# Patient Record
Sex: Female | Born: 1995 | Race: Black or African American | Hispanic: No | Marital: Single | Smoking: Never smoker
Health system: Southern US, Community
[De-identification: ages and names within clinical notes are randomized; demographics above are authoritative.]

## PROBLEM LIST (undated history)

## (undated) DIAGNOSIS — Z789 Other specified health status: Secondary | ICD-10-CM

## (undated) HISTORY — PX: APPENDECTOMY: SHX54

## (undated) HISTORY — PX: DILATION AND CURETTAGE OF UTERUS: SHX78

## (undated) HISTORY — DX: Other specified health status: Z78.9

---

## 2018-03-24 LAB — OB RESULTS CONSOLE ABO/RH: RH Type: POSITIVE

## 2018-03-24 LAB — OB RESULTS CONSOLE HGB/HCT, BLOOD: Hemoglobin: 10.2

## 2018-03-24 LAB — OB RESULTS CONSOLE GC/CHLAMYDIA
Chlamydia: NEGATIVE
Gonorrhea: NEGATIVE

## 2018-03-24 LAB — OB RESULTS CONSOLE PLATELET COUNT: Platelets: 162

## 2018-03-24 LAB — OB RESULTS CONSOLE ANTIBODY SCREEN: ANTIBODY SCREEN: NEGATIVE

## 2018-03-24 LAB — OB RESULTS CONSOLE HIV ANTIBODY (ROUTINE TESTING): HIV: NONREACTIVE

## 2018-03-24 LAB — OB RESULTS CONSOLE RUBELLA ANTIBODY, IGM: RUBELLA: IMMUNE

## 2018-03-24 LAB — OB RESULTS CONSOLE HEPATITIS B SURFACE ANTIGEN: HEP B S AG: NEGATIVE

## 2018-05-01 LAB — GLUCOSE, 1 HOUR: Glucose 1 Hour: 127

## 2018-05-19 LAB — OB RESULTS CONSOLE HGB/HCT, BLOOD: Hemoglobin: 11.2

## 2018-05-19 LAB — OB RESULTS CONSOLE PLATELET COUNT: PLATELETS: 160

## 2018-07-11 ENCOUNTER — Encounter: Payer: Self-pay | Admitting: Advanced Practice Midwife

## 2018-07-11 ENCOUNTER — Ambulatory Visit (INDEPENDENT_AMBULATORY_CARE_PROVIDER_SITE_OTHER): Payer: BLUE CROSS/BLUE SHIELD | Admitting: Advanced Practice Midwife

## 2018-07-11 DIAGNOSIS — Z3483 Encounter for supervision of other normal pregnancy, third trimester: Secondary | ICD-10-CM

## 2018-07-11 DIAGNOSIS — O99013 Anemia complicating pregnancy, third trimester: Secondary | ICD-10-CM

## 2018-07-11 DIAGNOSIS — Z3480 Encounter for supervision of other normal pregnancy, unspecified trimester: Secondary | ICD-10-CM | POA: Insufficient documentation

## 2018-07-11 DIAGNOSIS — D649 Anemia, unspecified: Secondary | ICD-10-CM

## 2018-07-11 DIAGNOSIS — Z113 Encounter for screening for infections with a predominantly sexual mode of transmission: Secondary | ICD-10-CM

## 2018-07-11 DIAGNOSIS — Z8759 Personal history of other complications of pregnancy, childbirth and the puerperium: Secondary | ICD-10-CM | POA: Insufficient documentation

## 2018-07-11 DIAGNOSIS — O99019 Anemia complicating pregnancy, unspecified trimester: Secondary | ICD-10-CM | POA: Insufficient documentation

## 2018-07-11 LAB — OB RESULTS CONSOLE GBS: GBS: NEGATIVE

## 2018-07-11 NOTE — Patient Instructions (Addendum)
Group B Streptococcus Infection During Pregnancy Group B Streptococcus (GBS) is a type of bacteria (Streptococcus agalactiae) that is often found in healthy people, commonly in the rectum, vagina, and intestines. In people who are healthy and not pregnant, the bacteria rarely cause serious illness or complications. However, women who test positive for GBS during pregnancy can pass the bacteria to their baby during childbirth, which can cause serious infection in the baby after birth. Women with GBS may also have infections during their pregnancy or immediately after childbirth, such as such as urinary tract infections (UTIs) or infections of the uterus (uterine infections). Having GBS also increases a woman's risk of complications during pregnancy, such as early (preterm) labor or delivery, miscarriage, or stillbirth. Routine testing (screening) for GBS is recommended for all pregnant women. What increases the risk? You may have a higher risk for GBS infection during pregnancy if you had one during a past pregnancy. What are the signs or symptoms? In most cases, GBS infection does not cause symptoms in pregnant women. Signs and symptoms of a possible GBS-related infection may include:  Labor starting before the 37th week of pregnancy.  A UTI or bladder infection, which may cause: ? Fever. ? Pain or burning during urination. ? Frequent urination.  Fever during labor, along with: ? Bad-smelling discharge. ? Uterine tenderness. ? Rapid heartbeat in the mother, baby, or both.  Rare but serious symptoms of a possible GBS-related infection in women include:  Blood infection (septicemia). This may cause fever, chills, or confusion.  Lung infection (pneumonia). This may cause fever, chills, cough, rapid breathing, difficulty breathing, or chest pain.  Bone, joint, skin, or soft tissue infection.  How is this diagnosed? You may be screened for GBS between week 35 and week 37 of your pregnancy. If  you have symptoms of preterm labor, you may be screened earlier. This condition is diagnosed based on lab test results from:  A swab of fluid from the vagina and rectum.  A urine sample.  How is this treated? This condition is treated with antibiotic medicine. When you go into labor, or as soon as your water breaks (your membranes rupture), you will be given antibiotics through an IV tube. Antibiotics will continue until after you give birth. If you are having a cesarean delivery, you do not need antibiotics unless your membranes have already ruptured. Follow these instructions at home:  Take over-the-counter and prescription medicines only as told by your health care provider.  Take your antibiotic medicine as told by your health care provider. Do not stop taking the antibiotic even if you start to feel better.  Keep all pre-birth (prenatal) visits and follow-up visits as told by your health care provider. This is important. Contact a health care provider if:  You have pain or burning when you urinate.  You have to urinate frequently.  You have a fever or chills.  You develop a bad-smelling vaginal discharge. Get help right away if:  Your membranes rupture.  You go into labor.  You have severe pain in your abdomen.  You have difficulty breathing.  You have chest pain. This information is not intended to replace advice given to you by your health care provider. Make sure you discuss any questions you have with your health care provider. Document Released: 03/14/2008 Document Revised: 07/02/2016 Document Reviewed: 07/01/2016 Elsevier Interactive Patient Education  2018 Else[employer] Third Trimester of Pregnancy The third trimester is from week 29 through week 42, months 7 through 9. This   This trimester is when your unborn baby (fetus) is growing very fast. At the end of the ninth month, the unborn baby is about 20 inches in length. It weighs about 6-10 pounds. Follow these  instructions at home:  Avoid all smoking, herbs, and alcohol. Avoid drugs not approved by your doctor.  Do not use any tobacco products, including cigarettes, chewing tobacco, and electronic cigarettes. If you need help quitting, ask your doctor. You may get counseling or other support to help you quit.  Only take medicine as told by your doctor. Some medicines are safe and some are not during pregnancy.  Exercise only as told by your doctor. Stop exercising if you start having cramps.  Eat regular, healthy meals.  Wear a good support bra if your breasts are tender.  Do not use hot tubs, steam rooms, or saunas.  Wear your seat belt when driving.  Avoid raw meat, uncooked cheese, and liter boxes and soil used by cats.  Take your prenatal vitamins.  Take 1500-2000 milligrams of calcium daily starting at the 20th week of pregnancy until you deliver your baby.  Try taking medicine that helps you poop (stool softener) as needed, and if your doctor approves. Eat more fiber by eating fresh fruit, vegetables, and whole grains. Drink enough fluids to keep your pee (urine) clear or pale yellow.  Take warm water baths (sitz baths) to soothe pain or discomfort caused by hemorrhoids. Use hemorrhoid cream if your doctor approves.  If you have puffy, bulging veins (varicose veins), wear support hose. Raise (elevate) your feet for 15 minutes, 3-4 times a day. Limit salt in your diet.  Avoid heavy lifting, wear low heels, and sit up straight.  Rest with your legs raised if you have leg cramps or low back pain.  Visit your dentist if you have not gone during your pregnancy. Use a soft toothbrush to brush your teeth. Be gentle when you floss.  You can have sex (intercourse) unless your doctor tells you not to.  Do not travel far distances unless you must. Only do so with your doctor's approval.  Take prenatal classes.  Practice driving to the hospital.  Pack your hospital bag.  Prepare the  baby's room.  Go to your doctor visits. Get help if:  You are not sure if you are in labor or if your water has broken.  You are dizzy.  You have mild cramps or pressure in your lower belly (abdominal).  You have a nagging pain in your belly area.  You continue to feel sick to your stomach (nauseous), throw up (vomit), or have watery poop (diarrhea).  You have bad smelling fluid coming from your vagina.  You have pain with peeing (urination). Get help right away if:  You have a fever.  You are leaking fluid from your vagina.  You are spotting or bleeding from your vagina.  You have severe belly cramping or pain.  You lose or gain weight rapidly.  You have trouble catching your breath and have chest pain.  You notice sudden or extreme puffiness (swelling) of your face, hands, ankles, feet, or legs.  You have not felt the baby move in over an hour.  You have severe headaches that do not go away with medicine.  You have vision changes. This information is not intended to replace advice given to you by your health care provider. Make sure you discuss any questions you have with your health care provider. Document Released: 03/02/2010 Document Revised:  Document Reviewed: 02/06/2013 Elsevier Interactive Patient Education  2017 Elsevier Inc.  

## 2018-07-11 NOTE — Progress Notes (Signed)
  Subjective:    Blair HaileyLidwina Perrone is being seen today for her first obstetrical visit.  She is a transfer from [place].  Moved here to be with FOB (not with her today, he works in [place] 3 days a week).  Just moved here.  Records reviewed.  No major problems with pregnancy so far.     This is not a planned pregnancy. She is at 1882w2d gestation. Her obstetrical history is significant for late transfer of care. Relationship with FOB: significant other, living together. Patient does intend to breast feed. Pregnancy history fully reviewed.  Patient reports no complaints.  Review of Systems:   Review of Systems  Objective:     BP 107/67   Pulse 93   Ht 5\' 2"  (1.575 m)   Wt 145 lb (65.8 kg)   LMP 10/30/2017 (Exact Date)   BMI 26.52 kg/m  Physical Exam  Exam    Assessment:    Pregnancy: [id number] Patient Active Problem List   Diagnosis Date Noted  . Supervision of other normal pregnancy, antepartum 07/11/2018  . 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 Anemia in pregnancy 07/11/2018       Plan:     Initial labs drawn. Prenatal vitamins. Problem list reviewed and updated. AFP3 discussed: declined. Role of ultrasound in pregnancy discussed; fetal survey: results reviewed. Amniocentesis discussed: not indicated. Follow up in 1 weeks. 50% of 30 min visit spent on counseling and coordination of care.   Welcomed to practice [facility], discussed [facility], discussed [facility] for after baby born Routines reviewed GBS and cultures done today   Wynelle BourgeoisMarie Chukwudi Ewen 07/11/2018

## 2018-07-12 LAB — GC/CHLAMYDIA PROBE AMP (~~LOC~~) NOT AT ARMC
CHLAMYDIA, DNA PROBE: NEGATIVE
Neisseria Gonorrhea: NEGATIVE

## 2018-07-15 LAB — CULTURE, BETA STREP (GROUP B ONLY): Strep Gp B Culture: NEGATIVE

## 2018-07-18 ENCOUNTER — Ambulatory Visit (INDEPENDENT_AMBULATORY_CARE_PROVIDER_SITE_OTHER): Payer: BLUE CROSS/BLUE SHIELD | Admitting: Advanced Practice Midwife

## 2018-07-18 DIAGNOSIS — Z3483 Encounter for supervision of other normal pregnancy, third trimester: Secondary | ICD-10-CM

## 2018-07-18 DIAGNOSIS — Z3480 Encounter for supervision of other normal pregnancy, unspecified trimester: Secondary | ICD-10-CM

## 2018-07-18 NOTE — Progress Notes (Signed)
   PRENATAL VISIT NOTE  Subjective:  Alexandria Barber is a 22 y.o. G3P0020 at 2965w2d being seen today for ongoing prenatal care.  She is currently monitored for the following issues for this low-risk pregnancy and has Supervision of other normal pregnancy, antepartum; History of molar pregnancy; and Anemia in pregnancy on their problem list.  Patient reports no complaints.  Contractions: Irregular. Vag. Bleeding: None.  Movement: Present. Denies leaking of fluid.   The following portions of the patient's history were reviewed and updated as appropriate: allergies, current medications, past family history, past medical history, past social history, past surgical history and problem list. Problem list updated.  Objective:   Vitals:   07/18/18 1028  BP: 108/64  Pulse: 92  Weight: 148 lb 1.3 oz (67.2 kg)    Fetal Status: Fetal Heart Rate (bpm): 139   Movement: Present     General:  Alert, oriented and cooperative. Patient is in no acute distress.  Skin: Skin is warm and dry. No rash noted.   Cardiovascular: Normal heart rate noted  Respiratory: Normal respiratory effort, no problems with respiration noted  Abdomen: Soft, gravid, appropriate for gestational age.  Pain/Pressure: Present     Pelvic: Cervical exam deferred       Declines exam  Extremities: Normal range of motion.  Edema: Trace  Mental Status: Normal mood and affect. Normal behavior. Normal judgment and thought content.   Assessment and Plan:  Pregnancy: G3P0020 at 3065w2d  Travel:  States needs to drive to [place] this week "to do something important". Only one day.  States someone else is driving.  Discussed precautions.  Fluid, frequent breaks to walk, how to find care if goes into labor or other emergency.  Has copy of records.  Informed GBS is Negative . Term labor symptoms and general obstetric precautions including but not limited to vaginal bleeding, contractions, leaking of fluid and fetal movement were reviewed in  detail with the patient. Please refer to After Visit Summary for other counseling recommendations.    Future Appointments  Date Time Provider Department Center  07/25/2018  9:10 AM Katrinka BlazingSmith, [place], CNM [facility] None    Wynelle BourgeoisMarie Williams, CNM

## 2018-07-20 ENCOUNTER — Encounter: Payer: Self-pay | Admitting: Advanced Practice Midwife

## 2018-07-20 NOTE — Patient Instructions (Signed)

## 2018-07-25 ENCOUNTER — Encounter: Payer: Self-pay | Admitting: Nurse Practitioner

## 2018-07-25 ENCOUNTER — Ambulatory Visit (INDEPENDENT_AMBULATORY_CARE_PROVIDER_SITE_OTHER): Payer: BLUE CROSS/BLUE SHIELD | Admitting: Nurse Practitioner

## 2018-07-25 VITALS — BP 111/59 | HR 80 | Wt 152.0 lb

## 2018-07-25 DIAGNOSIS — Z3480 Encounter for supervision of other normal pregnancy, unspecified trimester: Secondary | ICD-10-CM

## 2018-07-25 NOTE — Progress Notes (Signed)
    Subjective:  Alexandria Barber is a 22 y.o. G3P0020 at 6828w2d being seen today for ongoing prenatal care.  She is currently monitored for the following issues for this low-risk pregnancy and has Supervision of other normal pregnancy, antepartum; History of molar pregnancy; and Anemia in pregnancy on their problem list.  Patient reports pain in hands bilaterally.  Contractions: Irritability. Vag. Bleeding: None.  Movement: Present. Denies leaking of fluid.   The following portions of the patient's history were reviewed and updated as appropriate: allergies, current medications, past family history, past medical history, past social history, past surgical history and problem list. Problem list updated.  Objective:   Vitals:   07/25/18 0942  BP: (!) 111/59  Pulse: 80  Weight: 152 lb (68.9 kg)    Fetal Status: Fetal Heart Rate (bpm): 145 Fundal Height: 38 cm Movement: Present     General:  Alert, oriented and cooperative. Patient is in no acute distress.  Skin: Skin is warm and dry. No rash noted.   Cardiovascular: Normal heart rate noted  Respiratory: Normal respiratory effort, no problems with respiration noted  Abdomen: Soft, gravid, appropriate for gestational age. Pain/Pressure: Present     Pelvic:  Cervical exam deferred        Extremities: Normal range of motion.  Edema: None  Mental Status: Normal mood and affect. Normal behavior. Normal judgment and thought content.   Urinalysis: Urine Protein: Negative Urine Glucose: Negative  Assessment and Plan:  Pregnancy: G3P0020 at 4528w2d  1. Supervision of other normal pregnancy, antepartum Doing well.  Having some carpal tunnel pain in hands bilaterally but declined getting some braces for her wrists at night.  Advised it will likely improve once the baby is born.  No edema in ankles but noted small amount in upper feet and hands.  Term labor symptoms and general obstetric precautions including but not limited to vaginal bleeding,  contractions, leaking of fluid and fetal movement were reviewed in detail with the patient. Please refer to After Visit Summary for other counseling recommendations.  Return in about 1 week (around 08/01/2018).  Nolene BernheimERRI Georganna Maxson, RN, MSN, NP-BC Nurse Practitioner, [facility], [facility] 07/25/2018 10:11 AM

## 2018-07-25 NOTE — Patient Instructions (Signed)
Braxton Hicks Contractions °Contractions of the uterus can occur throughout pregnancy, but they are not always a sign that you are in labor. You may have practice contractions called Braxton Hicks contractions. These false labor contractions are sometimes confused with true labor. °What are Braxton Hicks contractions? °Braxton Hicks contractions are tightening movements that occur in the muscles of the uterus before labor. Unlike true labor contractions, these contractions do not result in opening (dilation) and thinning of the cervix. Toward the end of pregnancy (32-34 weeks), Braxton Hicks contractions can happen more often and may become stronger. These contractions are sometimes difficult to tell apart from true labor because they can be very uncomfortable. You should not feel embarrassed if you go to the hospital with false labor. °Sometimes, the only way to tell if you are in true labor is for your health care provider to look for changes in the cervix. The health care provider will do a physical exam and may monitor your contractions. If you are not in true labor, the exam should show that your cervix is not dilating and your water has not broken. °If there are other health problems associated with your pregnancy, it is completely safe for you to be sent home with false labor. You may continue to have Braxton Hicks contractions until you go into true labor. °How to tell the difference between true labor and false labor °True labor °· Contractions last 30-70 seconds. °· Contractions become very regular. °· Discomfort is usually felt in the top of the uterus, and it spreads to the lower abdomen and low back. °· Contractions do not go away with walking. °· Contractions usually become more intense and increase in frequency. °· The cervix dilates and gets thinner. °False labor °· Contractions are usually shorter and not as strong as true labor contractions. °· Contractions are usually irregular. °· Contractions  are often felt in the front of the lower abdomen and in the groin. °· Contractions may go away when you walk around or change positions while lying down. °· Contractions get weaker and are shorter-lasting as time goes on. °· The cervix usually does not dilate or become thin. °Follow these instructions at home: °· Take over-the-counter and prescription medicines only as told by your health care provider. °· Keep up with your usual exercises and follow other instructions from your health care provider. °· Eat and drink lightly if you think you are going into labor. °· If Braxton Hicks contractions are making you uncomfortable: °? Change your position from lying down or resting to walking, or change from walking to resting. °? Sit and rest in a tub of warm water. °? Drink enough fluid to keep your urine pale yellow. Dehydration may cause these contractions. °? Do slow and deep breathing several times an hour. °· Keep all follow-up prenatal visits as told by your health care provider. This is important. °Contact a health care provider if: °· You have a fever. °· You have continuous pain in your abdomen. °Get help right away if: °· Your contractions become stronger, more regular, and closer together. °· You have fluid leaking or gushing from your vagina. °· You pass blood-tinged mucus (bloody show). °· You have bleeding from your vagina. °· You have low back pain that you never had before. °· You feel your baby’s head pushing down and causing pelvic pressure. °· Your baby is not moving inside you as much as it used to. °Summary °· Contractions that occur before labor are called Braxton   Hicks contractions, false labor, or practice contractions. °· Braxton Hicks contractions are usually shorter, weaker, farther apart, and less regular than true labor contractions. True labor contractions usually become progressively stronger and regular and they become more frequent. °· Manage discomfort from Braxton Hicks contractions by  changing position, resting in a warm bath, drinking plenty of water, or practicing deep breathing. °This information is not intended to replace advice given to you by your health care provider. Make sure you discuss any questions you have with your health care provider. °Document Released: 04/21/2017 Document Revised: 04/21/2017 Document Reviewed: 04/21/2017 °Elsevier Interactive Patient Education © 2018 Elsevier Inc. ° °

## 2018-08-03 ENCOUNTER — Ambulatory Visit (INDEPENDENT_AMBULATORY_CARE_PROVIDER_SITE_OTHER): Payer: BLUE CROSS/BLUE SHIELD | Admitting: Family Medicine

## 2018-08-03 ENCOUNTER — Telehealth (HOSPITAL_COMMUNITY): Payer: Self-pay | Admitting: *Deleted

## 2018-08-03 ENCOUNTER — Encounter (HOSPITAL_COMMUNITY): Payer: Self-pay | Admitting: *Deleted

## 2018-08-03 VITALS — BP 103/61 | HR 83 | Wt 153.1 lb

## 2018-08-03 DIAGNOSIS — Z3483 Encounter for supervision of other normal pregnancy, third trimester: Secondary | ICD-10-CM | POA: Diagnosis not present

## 2018-08-03 DIAGNOSIS — O99019 Anemia complicating pregnancy, unspecified trimester: Secondary | ICD-10-CM

## 2018-08-03 DIAGNOSIS — Z3480 Encounter for supervision of other normal pregnancy, unspecified trimester: Secondary | ICD-10-CM

## 2018-08-03 DIAGNOSIS — O99013 Anemia complicating pregnancy, third trimester: Secondary | ICD-10-CM

## 2018-08-03 NOTE — Telephone Encounter (Signed)
Preadmission screen  

## 2018-08-03 NOTE — Progress Notes (Signed)
   PRENATAL VISIT NOTE  Subjective:  Alexandria Barber is a 22 y.o. G3P0020 at 3238w4d being seen today for ongoing prenatal care.  She is currently monitored for the following issues for this low-risk pregnancy and has Supervision of other normal pregnancy, antepartum; History of molar pregnancy; and Anemia in pregnancy on their problem list.  Patient reports no complaints.  Contractions: Irregular. Vag. Bleeding: None.  Movement: Present. Denies leaking of fluid.   The following portions of the patient's history were reviewed and updated as appropriate: allergies, current medications, past family history, past medical history, past social history, past surgical history and problem list. Problem list updated.  Objective:   Vitals:   08/03/18 0934  BP: 103/61  Pulse: 83  Weight: 153 lb 1.9 oz (69.5 kg)    Fetal Status: Fetal Heart Rate (bpm): 132 Fundal Height: 39 cm Movement: Present  Presentation: Vertex  General:  Alert, oriented and cooperative. Patient is in no acute distress.  Skin: Skin is warm and dry. No rash noted.   Cardiovascular: Normal heart rate noted  Respiratory: Normal respiratory effort, no problems with respiration noted  Abdomen: Soft, gravid, appropriate for gestational age.  Pain/Pressure: Present     Pelvic: Cervical exam performed Dilation: 1 Effacement (%): 30 Station: -3  Extremities: Normal range of motion.  Edema: Trace  Mental Status: Normal mood and affect. Normal behavior. Normal judgment and thought content.   Assessment and Plan:  Pregnancy: G3P0020 at 5838w4d  1. Supervision of other normal pregnancy, antepartum FHT and FH normal BPP next week Induction scheduled for 8/25 Will do outpt foley balloon  2. Antepartum anemia   Term labor symptoms and general obstetric precautions including but not limited to vaginal bleeding, contractions, leaking of fluid and fetal movement were reviewed in detail with the patient. Please refer to After Visit  Summary for other counseling recommendations.  No follow-ups on file.  Future Appointments  Date Time Provider Department Center  08/10/2018 10:15 AM Alexandria Barber, [name], MD [facility] None    Alexandria HeritageJacob J Stinson, DO

## 2018-08-05 ENCOUNTER — Inpatient Hospital Stay (HOSPITAL_COMMUNITY)
Admission: AD | Admit: 2018-08-05 | Discharge: 2018-08-05 | Disposition: A | Discharge status: Home / Self Care | Payer: BLUE CROSS/BLUE SHIELD | Source: Ambulatory Visit | Attending: Family Medicine | Admitting: Family Medicine

## 2018-08-05 ENCOUNTER — Inpatient Hospital Stay (HOSPITAL_COMMUNITY)
Admission: AD | Admit: 2018-08-05 | Discharge: 2018-08-08 | DRG: 807 | Disposition: A | Discharge status: Home / Self Care | Payer: BLUE CROSS/BLUE SHIELD | Attending: Family Medicine | Admitting: Family Medicine

## 2018-08-05 ENCOUNTER — Encounter (HOSPITAL_COMMUNITY): Payer: Self-pay

## 2018-08-05 DIAGNOSIS — O471 False labor at or after 37 completed weeks of gestation: Secondary | ICD-10-CM

## 2018-08-05 DIAGNOSIS — Z8759 Personal history of other complications of pregnancy, childbirth and the puerperium: Secondary | ICD-10-CM

## 2018-08-05 DIAGNOSIS — Z3480 Encounter for supervision of other normal pregnancy, unspecified trimester: Secondary | ICD-10-CM

## 2018-08-05 DIAGNOSIS — D649 Anemia, unspecified: Secondary | ICD-10-CM | POA: Diagnosis present

## 2018-08-05 DIAGNOSIS — O99019 Anemia complicating pregnancy, unspecified trimester: Secondary | ICD-10-CM

## 2018-08-05 DIAGNOSIS — Z3A40 40 weeks gestation of pregnancy: Secondary | ICD-10-CM

## 2018-08-05 DIAGNOSIS — O9902 Anemia complicating childbirth: Principal | ICD-10-CM | POA: Diagnosis present

## 2018-08-05 MED ORDER — OXYCODONE HCL 5 MG PO TABS
5.0000 mg | ORAL_TABLET | Freq: Once | ORAL | Status: AC
  Administered 2018-08-05: 5 mg via ORAL
  Filled 2018-08-05: qty 1

## 2018-08-05 NOTE — MAU Note (Signed)
Urine in lab 

## 2018-08-05 NOTE — MAU Note (Signed)
Pt having ctx, about 7 minutes apart. No leaking or bleeding, just some bloody show.

## 2018-08-05 NOTE — MAU Note (Signed)
Reports ctx since 0500, now they are every 5-7 min, some bloody show, no LOF, +FM

## 2018-08-05 NOTE — Discharge Instructions (Signed)

## 2018-08-06 ENCOUNTER — Inpatient Hospital Stay (HOSPITAL_COMMUNITY): Payer: BLUE CROSS/BLUE SHIELD | Admitting: Anesthesiology

## 2018-08-06 ENCOUNTER — Encounter (HOSPITAL_COMMUNITY): Payer: Self-pay | Admitting: *Deleted

## 2018-08-06 ENCOUNTER — Other Ambulatory Visit: Payer: Self-pay

## 2018-08-06 DIAGNOSIS — Z3483 Encounter for supervision of other normal pregnancy, third trimester: Secondary | ICD-10-CM | POA: Diagnosis present

## 2018-08-06 DIAGNOSIS — Z3A40 40 weeks gestation of pregnancy: Secondary | ICD-10-CM

## 2018-08-06 DIAGNOSIS — D649 Anemia, unspecified: Secondary | ICD-10-CM | POA: Diagnosis present

## 2018-08-06 DIAGNOSIS — O9902 Anemia complicating childbirth: Secondary | ICD-10-CM | POA: Diagnosis present

## 2018-08-06 LAB — TYPE AND SCREEN
ABO/RH(D): AB POS
Antibody Screen: NEGATIVE

## 2018-08-06 LAB — RPR: RPR: NONREACTIVE

## 2018-08-06 LAB — CBC
HCT: 36.2 % (ref 36.0–46.0)
Hemoglobin: 12.6 g/dL (ref 12.0–15.0)
MCH: 31.4 pg (ref 26.0–34.0)
MCHC: 34.8 g/dL (ref 30.0–36.0)
MCV: 90.3 fL (ref 78.0–100.0)
PLATELETS: 139 10*3/uL — AB (ref 150–400)
RBC: 4.01 MIL/uL (ref 3.87–5.11)
RDW: 13.4 % (ref 11.5–15.5)
WBC: 7.2 10*3/uL (ref 4.0–10.5)

## 2018-08-06 LAB — ABO/RH: ABO/RH(D): AB POS

## 2018-08-06 MED ORDER — LACTATED RINGERS IV SOLN: 500.0000 mL | INTRAVENOUS | Status: DC | PRN

## 2018-08-06 MED ORDER — FENTANYL 2.5 MCG/ML BUPIVACAINE 1/10 % EPIDURAL INFUSION (WH - ANES)
14.0000 mL/h | INTRAMUSCULAR | Status: DC | PRN
  Administered 2018-08-06 (×3): 14 mL/h via EPIDURAL
  Filled 2018-08-06 (×2): qty 100

## 2018-08-06 MED ORDER — FENTANYL 2.5 MCG/ML BUPIVACAINE 1/10 % EPIDURAL INFUSION (WH - ANES)
INTRAMUSCULAR | Status: AC
  Filled 2018-08-06: qty 100

## 2018-08-06 MED ORDER — FLEET ENEMA 7-19 GM/118ML RE ENEM: 1.0000 | ENEMA | RECTAL | Status: DC | PRN

## 2018-08-06 MED ORDER — PHENYLEPHRINE 40 MCG/ML (10ML) SYRINGE FOR IV PUSH (FOR BLOOD PRESSURE SUPPORT)
80.0000 ug | PREFILLED_SYRINGE | INTRAVENOUS | Status: DC | PRN
  Filled 2018-08-06: qty 5

## 2018-08-06 MED ORDER — LIDOCAINE HCL (PF) 1 % IJ SOLN
INTRAMUSCULAR | Status: AC
  Filled 2018-08-06: qty 30

## 2018-08-06 MED ORDER — ACETAMINOPHEN 325 MG PO TABS
650.0000 mg | ORAL_TABLET | ORAL | Status: DC | PRN
  Administered 2018-08-06 – 2018-08-08 (×4): 650 mg via ORAL

## 2018-08-06 MED ORDER — OXYCODONE-ACETAMINOPHEN 5-325 MG PO TABS: 2.0000 | ORAL_TABLET | ORAL | Status: DC | PRN

## 2018-08-06 MED ORDER — DIBUCAINE 1 % RE OINT
1.0000 "application " | TOPICAL_OINTMENT | RECTAL | Status: DC | PRN
  Administered 2018-08-08: 1 via RECTAL
  Filled 2018-08-06: qty 28

## 2018-08-06 MED ORDER — PHENYLEPHRINE 40 MCG/ML (10ML) SYRINGE FOR IV PUSH (FOR BLOOD PRESSURE SUPPORT)
PREFILLED_SYRINGE | INTRAVENOUS | Status: AC
  Filled 2018-08-06: qty 10

## 2018-08-06 MED ORDER — LIDOCAINE HCL (PF) 1 % IJ SOLN
INTRAMUSCULAR | Status: DC | PRN
  Administered 2018-08-06: 13 mL via EPIDURAL

## 2018-08-06 MED ORDER — OXYTOCIN 40 UNITS IN LACTATED RINGERS INFUSION - SIMPLE MED
INTRAVENOUS | Status: AC
  Administered 2018-08-06: 2.5 [IU]/h via INTRAVENOUS
  Filled 2018-08-06: qty 1000

## 2018-08-06 MED ORDER — SENNOSIDES-DOCUSATE SODIUM 8.6-50 MG PO TABS
2.0000 | ORAL_TABLET | ORAL | Status: DC
  Administered 2018-08-06 – 2018-08-07 (×2): 2 via ORAL
  Filled 2018-08-06 (×2): qty 2

## 2018-08-06 MED ORDER — OXYTOCIN BOLUS FROM INFUSION
500.0000 mL | Freq: Once | INTRAVENOUS | Status: AC
  Administered 2018-08-06: 500 mL via INTRAVENOUS

## 2018-08-06 MED ORDER — SIMETHICONE 80 MG PO CHEW: 80.0000 mg | CHEWABLE_TABLET | ORAL | Status: DC | PRN

## 2018-08-06 MED ORDER — PRENATAL MULTIVITAMIN CH
1.0000 | ORAL_TABLET | Freq: Every day | ORAL | Status: DC
  Administered 2018-08-07 – 2018-08-08 (×2): 1 via ORAL
  Filled 2018-08-06 (×2): qty 1

## 2018-08-06 MED ORDER — ACETAMINOPHEN 325 MG PO TABS
650.0000 mg | ORAL_TABLET | ORAL | Status: DC | PRN
  Administered 2018-08-06: 650 mg via ORAL
  Filled 2018-08-06 (×5): qty 2

## 2018-08-06 MED ORDER — LACTATED RINGERS IV SOLN: 500.0000 mL | Freq: Once | INTRAVENOUS | Status: DC

## 2018-08-06 MED ORDER — SOD CITRATE-CITRIC ACID 500-334 MG/5ML PO SOLN: 30.0000 mL | ORAL | Status: DC | PRN

## 2018-08-06 MED ORDER — ONDANSETRON HCL 4 MG/2ML IJ SOLN: 4.0000 mg | Freq: Four times a day (QID) | INTRAMUSCULAR | Status: DC | PRN

## 2018-08-06 MED ORDER — BENZOCAINE-MENTHOL 20-0.5 % EX AERO
1.0000 | INHALATION_SPRAY | CUTANEOUS | Status: DC | PRN
Start: 2018-08-06 — End: 2018-08-08
  Filled 2018-08-06: qty 56

## 2018-08-06 MED ORDER — COCONUT OIL OIL: 1.0000 "application " | TOPICAL_OIL | Status: DC | PRN

## 2018-08-06 MED ORDER — ZOLPIDEM TARTRATE 5 MG PO TABS: 5.0000 mg | ORAL_TABLET | Freq: Every evening | ORAL | Status: DC | PRN

## 2018-08-06 MED ORDER — LIDOCAINE HCL (PF) 1 % IJ SOLN
30.0000 mL | INTRAMUSCULAR | Status: DC | PRN
  Filled 2018-08-06: qty 30

## 2018-08-06 MED ORDER — WITCH HAZEL-GLYCERIN EX PADS
1.0000 "application " | MEDICATED_PAD | CUTANEOUS | Status: DC | PRN
  Administered 2018-08-08: 1 via TOPICAL

## 2018-08-06 MED ORDER — TERBUTALINE SULFATE 1 MG/ML IJ SOLN
0.2500 mg | Freq: Once | INTRAMUSCULAR | Status: DC | PRN
  Filled 2018-08-06: qty 1

## 2018-08-06 MED ORDER — EPHEDRINE 5 MG/ML INJ
10.0000 mg | INTRAVENOUS | Status: DC | PRN
  Filled 2018-08-06: qty 2

## 2018-08-06 MED ORDER — OXYCODONE-ACETAMINOPHEN 5-325 MG PO TABS: 1.0000 | ORAL_TABLET | ORAL | Status: DC | PRN

## 2018-08-06 MED ORDER — ONDANSETRON HCL 4 MG PO TABS: 4.0000 mg | ORAL_TABLET | ORAL | Status: DC | PRN

## 2018-08-06 MED ORDER — ONDANSETRON HCL 4 MG/2ML IJ SOLN: 4.0000 mg | INTRAMUSCULAR | Status: DC | PRN

## 2018-08-06 MED ORDER — IBUPROFEN 600 MG PO TABS
600.0000 mg | ORAL_TABLET | Freq: Four times a day (QID) | ORAL | Status: DC
  Administered 2018-08-06 – 2018-08-08 (×7): 600 mg via ORAL
  Filled 2018-08-06 (×7): qty 1

## 2018-08-06 MED ORDER — OXYTOCIN 40 UNITS IN LACTATED RINGERS INFUSION - SIMPLE MED
1.0000 m[IU]/min | INTRAVENOUS | Status: DC
  Administered 2018-08-06: 2 m[IU]/min via INTRAVENOUS

## 2018-08-06 MED ORDER — DIPHENHYDRAMINE HCL 50 MG/ML IJ SOLN: 12.5000 mg | INTRAMUSCULAR | Status: DC | PRN

## 2018-08-06 MED ORDER — TETANUS-DIPHTH-ACELL PERTUSSIS 5-2.5-18.5 LF-MCG/0.5 IM SUSP: 0.5000 mL | Freq: Once | INTRAMUSCULAR | Status: DC

## 2018-08-06 MED ORDER — DIPHENHYDRAMINE HCL 25 MG PO CAPS: 25.0000 mg | ORAL_CAPSULE | Freq: Four times a day (QID) | ORAL | Status: DC | PRN

## 2018-08-06 MED ORDER — LACTATED RINGERS IV SOLN
INTRAVENOUS | Status: DC
  Administered 2018-08-06 (×3): via INTRAVENOUS

## 2018-08-06 MED ORDER — OXYTOCIN 40 UNITS IN LACTATED RINGERS INFUSION - SIMPLE MED
2.5000 [IU]/h | INTRAVENOUS | Status: DC
  Administered 2018-08-06: 2.5 [IU]/h via INTRAVENOUS

## 2018-08-06 NOTE — Anesthesia Procedure Notes (Signed)
Epidural Patient location during procedure: OB Start time: 08/06/2018 2:37 AM End time: 08/06/2018 2:53 AM  Staffing Anesthesiologist: Lowella CurbMiller, Kyngston Pickelsimer Ray, MD Performed: anesthesiologist   Preanesthetic Checklist Completed: patient identified, site marked, surgical consent, pre-op evaluation, timeout performed, IV checked, risks and benefits discussed and monitors and equipment checked  Epidural Patient position: sitting Prep: ChloraPrep Patient monitoring: heart rate, cardiac monitor, continuous pulse ox and blood pressure Approach: midline Location: L2-L3 Injection technique: LOR saline  Needle:  Needle type: Tuohy  Needle gauge: 17 G Needle length: 9 cm Needle insertion depth: 5 cm Catheter type: closed end flexible Catheter size: 20 Guage Catheter at skin depth: 9 cm Test dose: negative  Assessment Events: blood not aspirated, injection not painful, no injection resistance, negative IV test and no paresthesia  Additional Notes Reason for block:procedure for pain

## 2018-08-06 NOTE — H&P (Signed)
LABOR AND DELIVERY ADMISSION HISTORY AND PHYSICAL NOTE  Alexandria Barber is a 22 y.o. female 1063P0020 with IUP at 30105w0d by LMP presenting for SOL.  She reports positive fetal movement. She denies leakage of fluid or vaginal bleeding.  Prenatal History/Complications: PNC at [facility] established at 36w. Transferred PNC from [place].   Pregnancy complications:  - late transfer PNC  -h/o molar pregnancy  -anemia in pregnancy   Past Medical History: Past Medical History:  Diagnosis Date  . Medical history non-contributory     Past Surgical History: Past Surgical History:  Procedure Laterality Date  . APPENDECTOMY    . DILATION AND CURETTAGE OF UTERUS      Obstetrical History: OB History    Gravida  3   Para      Term      Preterm      AB  2   Living        SAB      TAB  1   Ectopic      Multiple      Live Births              Social History: Social History   Socioeconomic History  . Marital status: Single    Spouse name: Not on file  . Number of children: Not on file  . Years of education: Not on file  . Highest education level: Not on file  Occupational History  . Not on file  Social Needs  . Financial resource strain: Not hard at all  . Food insecurity:    Worry: Never true    Inability: Never true  . Transportation needs:    Medical: No    Non-medical: No  Tobacco Use  . Smoking status: Never Smoker  . Smokeless tobacco: Never Used  Substance and Sexual Activity  . Alcohol use: Never    Frequency: Never  . Drug use: Never  . Sexual activity: Yes    Birth control/protection: None  Lifestyle  . Physical activity:    Days per week: Not on file    Minutes per session: Not on file  . Stress: Not at all  Relationships  . Social connections:    Talks on phone: Not on file    Gets together: Not on file    Attends religious service: Not on file    Active member of club or organization: Not on file    Attends meetings of clubs or  organizations: Not on file    Relationship status: Not on file  Other Topics Concern  . Not on file  Social History Narrative  . Not on file    Family History: History reviewed. No pertinent family history.  Allergies: No Known Allergies  Medications Prior to Admission  Medication Sig Dispense Refill Last Dose  . ferrous sulfate 325 (65 FE) MG EC tablet Take 325 mg by mouth 3 (three) times daily with meals.   Past Week at Unknown time  . Prenatal Multivit-Min-Fe-FA (PRENATAL VITAMINS PO) Take by mouth.   Past Week at Unknown time     Review of Systems  All systems reviewed and negative except as stated in HPI  Physical Exam Blood pressure 123/77, pulse 80, temperature 98.4 F (36.9 C), resp. rate 20, height 5\' 2"  (1.575 m), weight 68.9 kg, last menstrual period 10/30/2017, SpO2 100 %. General appearance: alert, oriented, NAD Lungs: normal respiratory effort Heart: regular rate Abdomen: soft, non-tender; gravid, FH appropriate for GA Extremities: No calf swelling or tenderness  Presentation: cephalic Fetal monitoring: 130 bpm, mod variability, +acels, no decels  Uterine activity: q1-6 min  Dilation: 4.5 Effacement (%): 80 Station: -2 Exam by:: B Mosca RN  Prenatal labs: ABO, Rh: --/--/AB POS (08/18 0151) Antibody: PENDING (08/18 0151) Rubella: Immune (04/05 0000) RPR:   Pending  HBsAg: Negative (04/05 0000)  HIV: Non-reactive (04/05 0000)  GC/Chlamydia: Neg GBS:   Negative  1-hr GTT: 127  Genetic screening:  Declined  Anatomy US: Normal   Prenatal Transfer Tool  Maternal Diabetes: No Genetic Screening: Declined Maternal Ultrasounds/Referrals: Normal Fetal Ultrasounds or other Referrals:  None Maternal Substance Abuse:  No Significant Maternal Medications:  None Significant Maternal Lab Results: Lab values include: Group B Strep negative  Results for orders placed or performed during the hospital encounter of 08/05/18 (from the past 24 hour(s))  CBC    Collection Time: 08/06/18  1:51 AM  Result Value Ref Range   WBC 7.2 4.0 - 10.5 K/uL   RBC 4.01 3.87 - 5.11 MIL/uL   Hemoglobin 12.6 12.0 - 15.0 g/dL   HCT 98.136.2 19.136.0 - 47.846.0 %   MCV 90.3 78.0 - 100.0 fL   MCH 31.4 26.0 - 34.0 pg   MCHC 34.8 30.0 - 36.0 g/dL   RDW 29.513.4 62.111.5 - 30.815.5 %   Platelets 139 (L) 150 - 400 K/uL  Type and screen [facility]   Collection Time: 08/06/18  1:51 AM  Result Value Ref Range   ABO/RH(D) AB POS    Antibody Screen PENDING    Sample Expiration      08/09/2018 Performed at [facility], [address] [place], [place] [address]     Patient Active Problem List   Diagnosis Date Noted  . Indication for care in labor or delivery 08/06/2018  . Supervision of other normal pregnancy, antepartum 07/11/2018  . 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 Anemia in pregnancy 07/11/2018    Assessment: Alexandria Barber is a 22 y.o. G3P0020 at 6431w0d here for SOL.   #Labor: Expectant management.  #Pain: Epidural upon request.  #FWB: Cat I  #ID:  GBS neg  #MOF: Breast #MOC:Undecided   De Hollingsheadatherine L Wallace 08/06/2018, 2:26 AM

## 2018-08-06 NOTE — Anesthesia Preprocedure Evaluation (Signed)

## 2018-08-06 NOTE — Progress Notes (Addendum)
Labor Progress Note Alexandria HaileyLidwina Barber is a 22 y.o. G3P0020 at 8551w0d by U/S admitted in active labor  S:   O:  BP 138/79   Pulse 94   Temp 98.9 F (37.2 C) (Oral)   Resp 18   Ht 5\' 2"  (1.575 m)   Wt 68.9 kg   LMP 10/30/2017 (Exact Date)   SpO2 99%   BMI 27.80 kg/m  EFM: 140/moderate var/no decels  CVE: Dilation: 10 Dilation Complete Date: 08/06/18 Dilation Complete Time: 1601 Effacement (%): 100 Cervical Position: Middle Station: Plus 1 Presentation: Vertex Exam by:: Valentina Lucks. Woods, RN   A&P: 22 y.o. G3P0020 5051w0d admitted in active labor. #Labor: Progressing well. Pit running at 10 mili units/min #Pain: no epidural #FWB: reassuring EFM #GBS negative   Alexandria MoPeter Frank, MD 4:40 PM

## 2018-08-06 NOTE — MAU Note (Signed)
Pt presents with painful UC's. Was seen in MAU earlier today. Caontractions are now more painful

## 2018-08-06 NOTE — Progress Notes (Addendum)
Patient is 22 y.o. G3P0020 2744w0d admitted on 8/18 in active labor.   Delivery Note At 5:52 PM a viable female was delivered via Vaginal, Spontaneous (Presentation: Vertex; ROA ).  APGAR:6, 9; weight  .   Placenta status: Spontaneous, Shultz.  Cord: 3 vessels  with the following complications:none.    Anesthesia:  epidural Episiotomy: None Lacerations:  First degree Suture Repair: figure 8, 2-0 vicryl Est. Blood Loss (mL):  100 mL  Mom to postpartum.  Baby to Couplet care / Skin to Skin.  Mirian Moeter Frank 08/06/2018, 6:06 PM   Upon arrival patient was complete and pushing. She pushed with good maternal effort to deliver a healthy baby boy. Baby delivered without difficulty, was noted to have good tone and place on maternal abdomen for oral suctioning, drying and stimulation. Delayed cord clamping performed. Placenta delivered intact with 3V cord. Vaginal canal and perineum was inspected and repair of a first degree lac was completed; hemostatic. Pitocin was started and uterus massaged until bleeding slowed. Counts of sharps, instruments, and lap pads were all correct.   Mirian MoPeter Frank, MD PGY-1 8/18/20196:06 PM

## 2018-08-06 NOTE — Anesthesia Pain Management Evaluation Note (Signed)
  [facility] Pain Management Visit Note  Patient: Alexandria Barber, 22 y.o., female  "Hello I am a member of the anesthesia team at [facility]. We have an anesthesia team available at all times to provide care throughout the hospital, including epidural management and anesthesia for C-section. I don't know your plan for the delivery whether it a natural birth, water birth, IV sedation, nitrous supplementation, doula or epidural, but we want to meet your pain goals."   1.Was your pain managed to your expectations on prior hospitalizations?   No prior hospitalizations  2.What is your expectation for pain management during this hospitalization?     Epidural  3.How can we help you reach that goal? Maintain epidural until delivery.  Record the patient's initial score and the patient's pain goal.   Pain: 2  Pain Goal: 2 [facility] wants you to be able to say your pain was always managed very well.  Santhosh Gulino 08/06/2018

## 2018-08-06 NOTE — Anesthesia Postprocedure Evaluation (Signed)
Anesthesia Post Note  Patient: Alexandria Barber  Procedure(s) Performed: AN AD HOC LABOR EPIDURAL     Patient location during evaluation: Mother Baby Anesthesia Type: Epidural Level of consciousness: awake and alert Pain management: pain level controlled Vital Signs Assessment: post-procedure vital signs reviewed and stable Respiratory status: spontaneous breathing, nonlabored ventilation and respiratory function stable Cardiovascular status: stable Postop Assessment: no headache, no backache and epidural receding Anesthetic complications: no    Last Vitals:  Vitals:   08/06/18 1740 08/06/18 1748  BP:    Pulse:    Resp: 20 (!) 22  Temp:    SpO2:      Last Pain:  Vitals:   08/06/18 1701  TempSrc:   PainSc: 0-No pain   Pain Goal:                 [name]

## 2018-08-06 NOTE — Progress Notes (Signed)
Alexandria Barber is a 22 y.o. G3P0020 at 2854w0d by ultrasound admitted for active labor  Subjective:   Objective: BP 114/77   Pulse 87   Temp 98.1 F (36.7 C) (Oral)   Resp 18   Ht 5\' 2"  (1.575 m)   Wt 68.9 kg   LMP 10/30/2017 (Exact Date)   SpO2 99%   BMI 27.80 kg/m  No intake/output data recorded. No intake/output data recorded.  FHT:  FHR: 130's bpm, variability: moderate,  accelerations:  Present,  decelerations:  Absent UC:   regular, every 2-4 minutes SVE:   Dilation: 6 Effacement (%): 90 Station: -1 Exam by:: Valentina Lucks. Woods, RN  Labs: Lab Results  Component Value Date   WBC 7.2 08/06/2018   HGB 12.6 08/06/2018   HCT 36.2 08/06/2018   MCV 90.3 08/06/2018   PLT 139 (L) 08/06/2018    Assessment / Plan: Spontaneous labor, progressing normally  Labor: Progressing normally Preeclampsia:  no signs or symptoms of toxicity Fetal Wellbeing:  Category I Pain Control:  Labor support without medications I/D:  n/a Anticipated MOD:  NSVD  Alexandria Barber 08/06/2018, 11:25 AM

## 2018-08-07 NOTE — Progress Notes (Addendum)
Post Partum Day 1 Subjective: Patient is doing well with no complaints. She denies headache, changes in vision, RUQ pain, chest pain, SOB, or excessive welling in her legs. She is ambulating and urinating. She has not had a bowel movement or passed flatus. She is currently breastfeeding but plans on eventually breast and bottle feeding at the same time. She plans for an inpatient circumcision. She is undecided on method of contraception and would like to "work on her weight" before deciding.   Objective: Blood pressure 116/69, pulse 84, temperature 98.4 F (36.9 C), temperature source Oral, resp. rate 16, height 5\' 2"  (1.575 m), weight 68.9 kg, last menstrual period 10/30/2017, SpO2 99 %, unknown if currently breastfeeding.  Physical Exam:  General: alert and no distress Lochia: appropriate Uterine Fundus: firm Incision: n/a DVT Evaluation: No evidence of DVT seen on physical exam.  Recent Labs    08/06/18 0151  HGB 12.6  HCT 36.2    Assessment/Plan: Alexandria Barber is a 22 year-old G3P1021 who is PPD #1 from SVD. She is doing well and is in good condition.Plan for inpatient circumcision. Plan for discharge tomorrow.    LOS: 1 day   Natasha MeadBryan A Gayle Collard 08/07/2018, 7:49 AM

## 2018-08-07 NOTE — Anesthesia Postprocedure Evaluation (Signed)
Anesthesia Post Note  Patient: Alexandria HaileyLidwina Barber  Procedure(s) Performed: AN AD HOC LABOR EPIDURAL     Patient location during evaluation: Mother Baby Anesthesia Type: Epidural Level of consciousness: awake and alert Pain management: pain level controlled Vital Signs Assessment: post-procedure vital signs reviewed and stable Respiratory status: spontaneous breathing, nonlabored ventilation and respiratory function stable Cardiovascular status: stable Postop Assessment: no headache, no backache, epidural receding, no apparent nausea or vomiting, patient able to bend at knees, adequate PO intake and able to ambulate Anesthetic complications: no    Last Vitals:  Vitals:   08/07/18 0109 08/07/18 0523  BP: 107/64 116/69  Pulse: 81 84  Resp: 16 16  Temp: 36.7 C 36.9 C  SpO2:  99%    Last Pain:  Vitals:   08/07/18 0523  TempSrc: Oral  PainSc: 5    Pain Goal:                 Laban EmperorMalinova,Alexandria Barber

## 2018-08-07 NOTE — Addendum Note (Signed)
Addendum  created 08/07/18 [id number] by Elgie CongoMalinova, Valary Manahan H, CRNA   Charge Capture section accepted, Sign clinical note

## 2018-08-07 NOTE — Lactation Note (Signed)
This note was copied from a baby's chart. Lactation Consultation Note  Patient Name: Alexandria Barber ZOXWR'UToday's Date: 08/07/2018 Reason for consult: Initial assessment;Primapara;1st time breastfeeding  Baby  Is 18 hours old  LC reviewed and updated the doc flow sheets per mom  Baby has breast fed x 6 , with latch scores - 7-9's.  Latch score at this consult 9, multiple swallows noted and increased with  Breast compressions.  Mom mentioned she wants to know her baby will take a bottle. LC reviewed basics of breastfeeding and the benefits of breast milk and  Establishing milk supply / importance of giving the baby the opportunity to learn her well.  LC praised mom for the amount of swallows noted from the baby.  Per mom I will try breast feeding. LC reassured mom RN and LC's are here to assist.  Mother informed of post-discharge support and given phone number to the lactation department, including services for phone call assistance; out-patient appointments; and breastfeeding support group. List of other breastfeeding resources in the community given in the handout. Encouraged mother to call for problems or concerns related to breastfeeding.   Maternal Data Has patient been taught Hand Expression?: Yes Does the patient have breastfeeding experience prior to this delivery?: No  Feeding Feeding Type: Breast Fed Length of feed: (leftt breast / swallows )  LATCH Score Latch: Grasps breast easily, tongue down, lips flanged, rhythmical sucking.  Audible Swallowing: Spontaneous and intermittent  Type of Nipple: Everted at rest and after stimulation  Comfort (Breast/Nipple): Soft / non-tender  Hold (Positioning): Assistance needed to correctly position infant at breast and maintain latch.  LATCH Score: 9  Interventions Interventions: Breast feeding basics reviewed;Assisted with latch;Skin to skin;Breast massage;Breast compression;Adjust position;Support pillows;Position  options  Lactation Tools Discussed/Used WIC Program: No(per mom )   Consult Status Consult Status: Follow-up Date: 08/08/18 Follow-up type: In-patient    [name] 08/07/2018, 12:25 PM

## 2018-08-08 MED ORDER — IBUPROFEN 600 MG PO TABS: 600.0000 mg | ORAL_TABLET | Freq: Four times a day (QID) | ORAL | 0 refills | Status: AC

## 2018-08-08 MED ORDER — DIBUCAINE 1 % RE OINT: 1.0000 "application " | TOPICAL_OINTMENT | RECTAL | 0 refills | Status: AC | PRN

## 2018-08-08 MED ORDER — WITCH HAZEL-GLYCERIN EX PADS: 1.0000 "application " | MEDICATED_PAD | CUTANEOUS | 12 refills | Status: AC | PRN

## 2018-08-08 MED ORDER — ACETAMINOPHEN 325 MG PO TABS: 650.0000 mg | ORAL_TABLET | ORAL | Status: AC | PRN

## 2018-08-08 NOTE — Lactation Note (Addendum)
This note was copied from a baby's chart. Lactation Consultation Note  Patient Name: Alexandria Barber   Attempted to consult with mom. Mom and infant asleep in the mom's bed. Mom aroused and [facility]. Enc mom not to sleep with infant in the bed. Mom rolled over and said she needed a nap. Will follow up at a later time.      Maternal Data    Feeding Feeding Type: Breast Fed  LATCH Score Latch: Repeated attempts needed to sustain latch, nipple held in mouth throughout feeding, stimulation needed to elicit sucking reflex.  Audible Swallowing: Spontaneous and intermittent  Type of Nipple: Everted at rest and after stimulation  Comfort (Breast/Nipple): Soft / non-tender  Hold (Positioning): Assistance needed to correctly position infant at breast and maintain latch.  LATCH Score: 8  Interventions    Lactation Tools Discussed/Used     Consult Status      Alexandria Barber Barber, 9:15 AM

## 2018-08-08 NOTE — Lactation Note (Signed)
This note was copied from a baby's chart. Lactation Consultation Note: Mom has baby latched to breast during lab work when I went into room. Reports no pain with latch. Reviewed engorgement prevention and treatment. Has manual pump for home. States she wants to pump and bottle feed also. Encouraged to wait a few [name] before introducing bottle. Reviewed our phone number, OP appointments and BFS[facility] as resources for support after DC. No questions at present. To call prn  Patient Name: Alexandria Blair HaileyLidwina Barber ZOXWR'UToday's Date: 08/08/2018 Reason for consult: Follow-up assessment   Maternal Data Formula Feeding for Exclusion: Yes Reason for exclusion: Mother's choice to formula and breast feed on admission Has patient been taught Hand Expression?: Yes Does the patient have breastfeeding experience prior to this delivery?: No  Feeding Feeding Type: Breast Fed Length of feed: 10 min  LATCH Score Latch: Grasps breast easily, tongue down, lips flanged, rhythmical sucking.  Audible Swallowing: A few with stimulation  Type of Nipple: Everted at rest and after stimulation  Comfort (Breast/Nipple): Soft / non-tender  Hold (Positioning): No assistance needed to correctly position infant at breast.  LATCH Score: 9  Interventions Interventions: Breast feeding basics reviewed;Hand express;Breast compression  Lactation Tools Discussed/Used     Consult Status Consult Status: Complete    Pamelia HoitWeeks, Thatcher Doberstein D 08/08/2018, 10:17 AM

## 2018-08-08 NOTE — Discharge Instructions (Signed)

## 2018-08-08 NOTE — Discharge Summary (Signed)
OB Discharge Summary     Patient Name: Blair HaileyLidwina Mayhall DOB: 04-28-1996 MRN: [id number]  Date of admission: 08/05/2018 Delivering MD: Zerita BoersLAWSON, DARLENE   Date of discharge: 08/08/2018  Admitting diagnosis: 40 WKS, CTXS Intrauterine pregnancy: 7546w0d     Secondary diagnosis:  Active Problems:   Indication for care in labor or delivery   Vaginal delivery  Additional problems: N/A     Discharge diagnosis: Term Pregnancy Delivered                                                                                                Post partum procedures:N/A  Augmentation: Pitocine  Complications: None  Hospital course:  Onset of Labor With Vaginal Delivery     22 y.o. yo [id number] at 7546w0d was admitted in Active Labor on 08/05/2018. Patient had an uncomplicated labor course as follows:  Membrane Rupture Time/Date: 12:10 PM ,08/06/2018   Intrapartum Procedures: Episiotomy: None [1]                                         Lacerations:  1st degree [2];Perineal [11]  Patient had a delivery of a Viable infant. 08/06/2018  Information for the patient's newborn:  Lorrin GoodellFonmboh, Boy Ludean [[id number]][[id number]]  Delivery Method: Vag-Spont    Pateint had an uncomplicated postpartum course.  She is ambulating, tolerating a regular diet, passing flatus, and urinating well. Patient is discharged home in stable condition on 08/08/18.   Physical exam  Vitals:   08/07/18 0523 08/07/18 0955 08/07/18 2050 08/08/18 0522  BP: 116/69 (!) 104/54 114/61 101/70  Pulse: 84 87 86 69  Resp: 16 18 20 17   Temp: 98.4 F (36.9 C) 97.9 F (36.6 C) 98.8 F (37.1 C) 98.1 F (36.7 C)  TempSrc: Oral Oral Oral Oral  SpO2: 99% 100%  100%  Weight:      Height:       General: alert, cooperative and no distress Lochia: appropriate Uterine Fundus: firm Incision: N/A DVT Evaluation: No evidence of DVT seen on physical exam. Labs: Lab Results  Component Value Date   WBC 7.2 08/06/2018   HGB 12.6 08/06/2018   HCT 36.2  08/06/2018   MCV 90.3 08/06/2018   PLT 139 (L) 08/06/2018   No flowsheet data found.  Discharge instruction: per After Visit Summary and "Baby and Me Booklet".  After visit meds:  Allergies as of 08/08/2018   No Known Allergies     Medication List    STOP taking these medications   ferrous sulfate 325 (65 FE) MG EC tablet   PRENATAL VITAMINS PO     TAKE these medications   acetaminophen 325 MG tablet Commonly known as:  TYLENOL Take 2 tablets (650 mg total) by mouth every 4 (four) hours as needed (for pain scale < 4).   dibucaine 1 % Oint Commonly known as:  NUPERCAINAL Place 1 application rectally as needed for hemorrhoids.   ibuprofen 600 MG tablet Commonly known as:  ADVIL,MOTRIN Take 1 tablet (600 mg total)  by mouth every 6 (six) hours.   witch hazel-glycerin pad Commonly known as:  TUCKS Apply 1 application topically as needed for hemorrhoids.       Diet: routine diet  Activity: Advance as tolerated. Pelvic rest for 6 weeks.   Outpatient follow up:4 weeks Follow up Appt: Future Appointments  Date Time Provider Department Center  08/09/2018  9:45 AM [facility] US 5 [facility]  08/10/2018 10:15 AM Willodean RosenthalHarraway-Smith, [name], MD [facility] None   Follow up Visit:No follow-ups on file.  Postpartum contraception: None  Newborn Data: Live born female  Birth Weight: 7 lb 8.6 oz (3420 g) APGAR: 6, 9  Newborn Delivery   Birth date/time:  08/06/2018 17:52:00 Delivery type:  Vaginal, Spontaneous     Baby Feeding: Breast Disposition:home with mother   08/08/2018 Calvert CantorSamantha C Weinhold, CNM

## 2018-08-09 ENCOUNTER — Encounter (HOSPITAL_COMMUNITY): Payer: Self-pay

## 2018-08-09 ENCOUNTER — Inpatient Hospital Stay (HOSPITAL_COMMUNITY)
Admission: AD | Admit: 2018-08-09 | Discharge: 2018-08-09 | Disposition: A | Discharge status: Home / Self Care | Payer: BLUE CROSS/BLUE SHIELD | Source: Ambulatory Visit | Attending: Obstetrics and Gynecology | Admitting: Obstetrics and Gynecology

## 2018-08-09 ENCOUNTER — Ambulatory Visit: Payer: Self-pay

## 2018-08-09 ENCOUNTER — Ambulatory Visit: Payer: BLUE CROSS/BLUE SHIELD | Admitting: Obstetrics & Gynecology

## 2018-08-09 ENCOUNTER — Ambulatory Visit (HOSPITAL_COMMUNITY): Payer: BLUE CROSS/BLUE SHIELD

## 2018-08-09 DIAGNOSIS — N949 Unspecified condition associated with female genital organs and menstrual cycle: Secondary | ICD-10-CM

## 2018-08-09 DIAGNOSIS — O9089 Other complications of the puerperium, not elsewhere classified: Secondary | ICD-10-CM | POA: Diagnosis not present

## 2018-08-09 DIAGNOSIS — R102 Pelvic and perineal pain: Secondary | ICD-10-CM | POA: Diagnosis not present

## 2018-08-09 LAB — URINALYSIS, ROUTINE W REFLEX MICROSCOPIC
BACTERIA UA: NONE SEEN
Bilirubin Urine: NEGATIVE
Glucose, UA: NEGATIVE mg/dL
Ketones, ur: NEGATIVE mg/dL
Nitrite: NEGATIVE
PROTEIN: 30 mg/dL — AB
RBC / HPF: >50 RBC/hpf — ABNORMAL HIGH (ref 0–5)
Specific Gravity, Urine: 1.014 (ref 1.005–1.030)
pH: 8 (ref 5.0–8.0)

## 2018-08-09 MED ORDER — OXYCODONE-ACETAMINOPHEN 5-325 MG PO TABS
2.0000 | ORAL_TABLET | Freq: Once | ORAL | Status: AC
  Administered 2018-08-09: 2 via ORAL
  Filled 2018-08-09: qty 2

## 2018-08-09 MED ORDER — IBUPROFEN 800 MG PO TABS
800.0000 mg | ORAL_TABLET | Freq: Once | ORAL | Status: AC
  Administered 2018-08-09: 800 mg via ORAL
  Filled 2018-08-09: qty 1

## 2018-08-09 NOTE — MAU Note (Signed)
Pt not in lobby.  

## 2018-08-09 NOTE — Discharge Instructions (Signed)
Postpartum Care After Vaginal Delivery °The period of time right after you deliver your newborn is called the postpartum period. °What kind of medical care will I receive? °· You may continue to receive fluids and medicines through an IV tube inserted into one of your veins. °· If an incision was made near your vagina (episiotomy) or if you had some vaginal tearing during delivery, cold compresses may be placed on your episiotomy or your tear. This helps to reduce pain and swelling. °· You may be given a squirt bottle to use when you go to the bathroom. You may use this until you are comfortable wiping as usual. To use the squirt bottle, follow these steps: °? Before you urinate, fill the squirt bottle with warm water. Do not use hot water. °? After you urinate, while you are sitting on the toilet, use the squirt bottle to rinse the area around your urethra and vaginal opening. This rinses away any urine and blood. °? You may do this instead of wiping. As you start healing, you may use the squirt bottle before wiping yourself. Make sure to wipe gently. °? Fill the squirt bottle with clean water every time you use the bathroom. °· You will be given sanitary pads to wear. °How can I expect to feel? °· You may not feel the need to urinate for several hours after delivery. °· You will have some soreness and pain in your abdomen and vagina. °· If you are breastfeeding, you may have uterine contractions every time you breastfeed for up to several weeks postpartum. Uterine contractions help your uterus return to its normal size. °· It is normal to have vaginal bleeding (lochia) after delivery. The amount and appearance of lochia is often similar to a menstrual period in the first week after delivery. It will gradually decrease over the next few weeks to a dry, yellow-brown discharge. For most women, lochia stops completely by 6-8 weeks after delivery. Vaginal bleeding can vary from woman to woman. °· Within the first few  days after delivery, you may have breast engorgement. This is when your breasts feel heavy, full, and uncomfortable. Your breasts may also throb and feel hard, tightly stretched, warm, and tender. After this occurs, you may have milk leaking from your breasts. Your health care provider can help you relieve discomfort due to breast engorgement. Breast engorgement should go away within a few days. °· You may feel more sad or worried than normal due to hormonal changes after delivery. These feelings should not last more than a few days. If these feelings do not go away after several days, speak with your health care provider. °How should I care for myself? °· Tell your health care provider if you have pain or discomfort. °· Drink enough water to keep your urine clear or pale yellow. °· Wash your hands thoroughly with soap and water for at least 20 seconds after changing your sanitary pads, after using the toilet, and before holding or feeding your baby. °· If you are not breastfeeding, avoid touching your breasts a lot. Doing this can make your breasts produce more milk. °· If you become weak or lightheaded, or you feel like you might faint, ask for help before: °? Getting out of bed. °? Showering. °· Change your sanitary pads frequently. Watch for any changes in your flow, such as a sudden increase in volume, a change in color, the passing of large blood clots. If you pass a blood clot from your vagina, save it   to show to your health care provider. Do not flush blood clots down the toilet without having your health care provider look at them. °· Make sure that all your vaccinations are up to date. This can help protect you and your baby from getting certain diseases. You may need to have immunizations done before you leave the hospital. °· If desired, talk with your health care provider about methods of family planning or birth control (contraception). °How can I start bonding with my baby? °Spending as much time as  possible with your baby is very important. During this time, you and your baby can get to know each other and develop a bond. Having your baby stay with you in your room (rooming in) can give you time to get to know your baby. Rooming in can also help you become comfortable caring for your baby. Breastfeeding can also help you bond with your baby. °How can I plan for returning home with my baby? °· Make sure that you have a car seat installed in your vehicle. °? Your car seat should be checked by a certified car seat installer to make sure that it is installed safely. °? Make sure that your baby fits into the car seat safely. °· Ask your health care provider any questions you have about caring for yourself or your baby. Make sure that you are able to contact your health care provider with any questions after leaving the hospital. °This information is not intended to replace advice given to you by your health care provider. Make sure you discuss any questions you have with your health care provider. °Document Released: 10/03/2007 Document Revised: 05/10/2016 Document Reviewed: 11/10/2015 °Elsevier Interactive Patient Education © 2018 [employer] ° °

## 2018-08-09 NOTE — MAU Provider Note (Signed)
History     CSN: [id number]  Arrival date and time: 08/09/18 [id number]   First Provider Initiated Contact with Patient 08/09/18 0321      Chief Complaint  Patient presents with  . Vaginal Pain   Alexandria Barber is 22 y.o. [id number] who is 3 days SP NSVD. She was DC home on 08/08/18, but the baby was not DC. So she has been here as a boarder mom. She states that she could not leave the hospital today to pick up any medications because her baby was still here. She has not had any pain medication since around 1100 on 08/08/18. She is reporting perineal pain and cramping.  Vaginal Pain  Primary symptoms comment: vaginal pain. This is a new problem. The current episode started today. The problem occurs constantly. The problem has been gradually worsening. Pain severity now: 8/10. She is not pregnant. Pertinent negatives include no chills, dysuria, fever, frequency, nausea or vomiting. Nothing aggravates the symptoms. She has tried acetaminophen and NSAIDs (patient states that she was unable to pick up medications. So she has not had any medications since 08/08/18 in the morning. ) for the symptoms.    OB History    Gravida  3   Para  1   Term  1   Preterm      AB  2   Living  1     SAB      TAB  1   Ectopic      Multiple  0   Live Births  1           Past Medical History:  Diagnosis Date  . Medical history non-contributory     Past Surgical History:  Procedure Laterality Date  . APPENDECTOMY    . DILATION AND CURETTAGE OF UTERUS      No family history on file.  Social History   Tobacco Use  . Smoking status: Never Smoker  . Smokeless tobacco: Never Used  Substance Use Topics  . Alcohol use: Never    Frequency: Never  . Drug use: Never    Allergies: No Known Allergies  Medications Prior to Admission  Medication Sig Dispense Refill Last Dose  . acetaminophen (TYLENOL) 325 MG tablet Take 2 tablets (650 mg total) by mouth every 4 (four) hours as needed (for  pain scale < 4).     . dibucaine (NUPERCAINAL) 1 % OINT Place 1 application rectally as needed for hemorrhoids.  0   . ibuprofen (ADVIL,MOTRIN) 600 MG tablet Take 1 tablet (600 mg total) by mouth every 6 (six) hours. 30 tablet 0   . witch hazel-glycerin (TUCKS) pad Apply 1 application topically as needed for hemorrhoids. 40 each 12     Review of Systems  Constitutional: Negative for chills and fever.  Gastrointestinal: Negative for nausea and vomiting.  Genitourinary: Positive for vaginal pain. Negative for dysuria and frequency.   Physical Exam   Blood pressure 114/69, pulse 86, temperature 98 F (36.7 C), resp. rate 19, height 5\' 2"  (1.575 m), weight 67.1 kg, unknown if currently breastfeeding.  Physical Exam  Nursing note and vitals reviewed. Constitutional: She is oriented to person, place, and time. She appears well-developed and well-nourished. No distress.  HENT:  Head: Normocephalic.  Cardiovascular: Normal rate.  Respiratory: Effort normal.  GI: Soft. There is no tenderness. There is no rebound.  Genitourinary:  Genitourinary Comments: Perineum suture intact, healing well, no evidence of hematoma.   Neurological: She is alert and oriented to person,  place, and time.  Skin: Skin is warm and dry.  Psychiatric: She has a normal mood and affect.    MAU Course  Procedures  MDM Patient given ibuprofen and percocet here. Recommended that she pick up her rx early in the day tomorrow and that she can leave the hospital even if the baby cannot.   Assessment and Plan   1. Postpartum examination following vaginal delivery   2. Perineal pain in female   3. Pelvic cramping    DC home Comfort measures reviewed  RX: no new rx. Patient advised to pick up rxs that had been written yesterday  Return to MAU as needed FU with OB as planned  Follow-up Information    [facility]   Specialty:  Obstetrics and Gynecology Contact information: [address] [place] [id number] [phone]          Thressa ShellerHeather Luberta Grabinski 08/09/2018, 3:22 AM

## 2018-08-09 NOTE — Lactation Note (Signed)
This note was copied from a baby's chart. Lactation Consultation Note  Patient Name: Alexandria Barber Reason for consult: Follow-up assessment Breasts are full this morning but not engorged.  Prevention and treatment of engorgement reviewed.  Mom has a manual pump for prn use.  Mom reports baby is latching easily and feeding well.  Weight loss at 6%.  Instructed to continue to feed with cues using good breast massage during the feeding.  Lactation outpatient services and support information reviewed and encouraged prn.  Maternal Data    Feeding Feeding Type: Breast Fed Length of feed: 10 min  LATCH Score                   Interventions    Lactation Tools Discussed/Used     Consult Status Consult Status: Complete Follow-up type: Call as needed    Huston FoleyMOULDEN, Saja Bartolini S Barber, 8:59 AM

## 2018-08-09 NOTE — MAU Note (Addendum)
Patient states her perineum is extremely painful no matter what she does.  Has not taken any pain medicine today because she doesn't have anyone to pick up her medication.  Has tried cool compresses and dermaplast spray.  Also has hemorrhoids.  Delivered 8/18.

## 2018-08-10 ENCOUNTER — Telehealth: Payer: Self-pay

## 2018-08-10 ENCOUNTER — Encounter: Payer: BLUE CROSS/BLUE SHIELD | Admitting: Obstetrics & Gynecology

## 2018-08-10 NOTE — Telephone Encounter (Signed)
Pt called the office stating that her stomach is big and hard. Pt states that she has not had a bowel movent in 1 week. Advised pt to drink miralax or prune juice with warm water. Pt states that she drank warm water yesterday and passed some blood clots but is having light bleeding today. Pt is scheduled for an appointment on 08/11/18.

## 2018-08-11 ENCOUNTER — Ambulatory Visit (INDEPENDENT_AMBULATORY_CARE_PROVIDER_SITE_OTHER): Payer: BLUE CROSS/BLUE SHIELD | Admitting: Obstetrics & Gynecology

## 2018-08-11 ENCOUNTER — Encounter: Payer: Self-pay | Admitting: Obstetrics & Gynecology

## 2018-08-11 VITALS — BP 111/73 | HR 65 | Wt 142.0 lb

## 2018-08-11 DIAGNOSIS — O9089 Other complications of the puerperium, not elsewhere classified: Secondary | ICD-10-CM

## 2018-08-11 DIAGNOSIS — R102 Pelvic and perineal pain: Secondary | ICD-10-CM

## 2018-08-11 MED ORDER — TRAMADOL HCL 50 MG PO TABS: 50.0000 mg | ORAL_TABLET | Freq: Four times a day (QID) | ORAL | 0 refills | Status: AC | PRN

## 2018-08-11 NOTE — Progress Notes (Signed)
History:  22 y.o. [id number] here today for c/o pain in her perineum where the stiches are. She was also seen in the MAU for eval of this pain. She reports taking Motrin, ice packs and Dermoplast with no relief of the pain.    The following portions of the patient's history were reviewed and updated as appropriate: allergies, current medications, past family history, past medical history, past social history, past surgical history and problem list.  Review of Systems:  Pertinent items are noted in HPI.    Objective:  Physical Exam Blood pressure 111/73, pulse 65, weight 142 lb (64.4 kg), unknown if currently breastfeeding.  CONSTITUTIONAL: Well-developed, well-nourished female in no acute distress.  HENT:  Normocephalic, atraumatic EYES: Conjunctivae and EOM are normal. No scleral icterus.  NECK: Normal range of motion SKIN: Skin is warm and dry. No rash noted. Not diaphoretic.No pallor. NEUROLGIC: Alert and oriented to person, place, and time. Normal coordination.  Abd: Soft, nontender and nondistended; FH well below the umbilicus.  Pelvic: Normal appearing external genitalia; normal appearing vaginal mucosa and cervix.  The perineum is healing well no evidence of hematoma. The skin is intact. There is no excessive tenderness. NO abnormal drainage noted.   Assessment & Plan:  1. Postpartum perineal pain  - traMADol (ULTRAM) 50 MG tablet; Take 1 tablet (50 mg total) by mouth every 6 (six) hours as needed for moderate pain or severe pain.  Dispense: 12 tablet; Refill: 0  Reviewed care of perineal lacaration  F/u in 4 weeks for routine PP check  Lajuan Kovaleski L. Harraway-Smith, M.D., Evern CoreFACOG

## 2018-08-11 NOTE — Progress Notes (Signed)
Patient complaining of vaginal pain. Patient is postpartum day 5 after vaginal delivery. Patient states she did have quarter size clots yesterday. Patient states she is in so much pain she cant sleep. Patient is using numbing spray every 45 minutes. Armandina StammerJennifer Ra Pfiester RN

## 2018-08-11 NOTE — Patient Instructions (Signed)
Care of a Perineal Tear °A perineal tear is a cut (laceration) in the tissue between the opening of the vagina and the anus (perineum). Some women naturally develop a perineal tear during a vaginal birth. This can happen as the baby emerges from the birth canal and the perineum is stretched. Perineal tears are graded based on how deep and long the laceration is. The grading for perineal tears is as follows: °· First degree. This involves a shallow tear at the edge of the vaginal opening that extends slightly into the perineal skin. °· Second degree. This involves tearing described in a first degree perineal tear and also a deeper tear of the vaginal opening and perineal tissues. It may also include tearing of a muscle just under the perineal skin. °· Third degree. This involves tearing described in a first and second degree perineal tear, with the tear extending into the muscle of the anus (anal sphincter). °· Fourth degree. This involves all levels of tear described for first, second, and third degree perineal tear, with the tear extending into the rectum. ° °First degree perineal tears may or may not be stitched closed, depending on their location and appearance. Second, third, and fourth degree perineal tears are stitched closed immediately after the baby’s birth. °What are the risks? °Depending on the type of perineal tear you have, you may be at risk for the following: °· Bleeding. °· Developing a collection of blood in the perineal tear area (hematoma). °· Pain. This may include pain with urination or bowel movements. °· Infection at the site of the tear. °· Fever. °· Trouble controlling your bowels (fecal incontinence). °· Painful sexual intercourse. ° °How to care for a perineal tear °· The first day, put ice on the area of the tear. °? Put ice in a plastic bag. °? Place a towel between your skin and the bag. °? Leave the ice on for 20 minutes, 2-3 times a day. °· Bathe using a warm sitz bath as directed by  your health care provider. This can speed up healing. Sitz baths can be performed in your bathtub or using a sitz bath kit that fits over your toilet. °? Place 3-4 in. (7.6-10 cm) of warm water in your bathtub or fill the sitz bath over-the-toilet container with warm water. Make sure the water is not too hot by placing a drop on your wrist. °? Sit in the warm water for 20-30 minutes. °? After bathing, pat your perineum dry with a clean towel. Do not scrub the perineum as this could cause pain, irritation, or open any stitches you may have. °? Keep the over-the-toilet sitz bath container clean by rinsing it thoroughly after each use. Ask for help in keeping the bathtub clean with diluted bleach and water (2 Tbsp [30 mL] of bleach to ½ gal [1.9 L] of water). °? Repeat the sitz bath as often as you would like to relieve perineal pain, itching, or discomfort. °· Apply a numbing spray to the perineal tear site as directed by your health care provider. This may help with discomfort. °· Wash your hands before and after applying medicine to the area. °· Put about 3 witch hazel-containing hemorrhoid treatment pads on top of your sanitary pad. The witch hazel in the hemorrhoid pads helps with discomfort and swelling. °· Get a squeeze bottle to squeeze warm water on your perineum when urinating, spraying the area from front to back. Pat the area to dry it. °· Sitting on an inflatable   ring or pillow may provide comfort.  Take medicines only as directed by your health care provider.  Do not have sexual intercourse or use tampons until your health care provider says it is okay. Typically, you must wait at least 6 weeks.  Keep all postpartum appointments as directed by your health care provider. Contact a health care provider if:  Your pain is not relieved with medicines.  You have painful urination.  You have a fever. Get help right away if:  You have redness, swelling, or increasing pain in the area of the  tear.  You have pus coming from the area of the tear.  You notice a bad smell coming from the area of the tear.  Your tear opens.  You notice swelling in the area of the tear that is larger than when you left the hospital.  You cannot urinate. This information is not intended to replace advice given to you by your health care provider. Make sure you discuss any questions you have with your health care provider. Document Released: 04/22/2014 Document Revised: 05/19/2016 Document Reviewed: 09/11/2013 Elsevier Interactive Patient Education  2017 [employer].

## 2018-08-13 ENCOUNTER — Inpatient Hospital Stay (HOSPITAL_COMMUNITY): Admission: RE | Admit: 2018-08-13 | Payer: BLUE CROSS/BLUE SHIELD | Source: Ambulatory Visit

## 2018-08-23 ENCOUNTER — Telehealth: Payer: Self-pay

## 2018-08-23 NOTE — Telephone Encounter (Signed)
A nurse with [facility] called stating patient is postpartum and having concern of "something feels like its falling out."  Nurse thinks patient would like to come in sooner than her postpartum. Visit scheduled. Armandina Stammer Rn

## 2018-08-28 ENCOUNTER — Ambulatory Visit: Payer: BLUE CROSS/BLUE SHIELD | Admitting: Family Medicine

## 2018-09-13 ENCOUNTER — Ambulatory Visit: Payer: BLUE CROSS/BLUE SHIELD | Admitting: Obstetrics & Gynecology

## 2018-09-22 ENCOUNTER — Other Ambulatory Visit: Payer: Self-pay

## 2018-09-22 ENCOUNTER — Emergency Department (HOSPITAL_BASED_OUTPATIENT_CLINIC_OR_DEPARTMENT_OTHER): Payer: BLUE CROSS/BLUE SHIELD

## 2018-09-22 ENCOUNTER — Emergency Department (HOSPITAL_BASED_OUTPATIENT_CLINIC_OR_DEPARTMENT_OTHER)
Admission: EM | Admit: 2018-09-22 | Discharge: 2018-09-22 | Disposition: A | Discharge status: Home / Self Care | Payer: BLUE CROSS/BLUE SHIELD | Attending: Emergency Medicine | Admitting: Emergency Medicine

## 2018-09-22 ENCOUNTER — Encounter (HOSPITAL_BASED_OUTPATIENT_CLINIC_OR_DEPARTMENT_OTHER): Payer: Self-pay | Admitting: *Deleted

## 2018-09-22 DIAGNOSIS — K5900 Constipation, unspecified: Secondary | ICD-10-CM | POA: Diagnosis not present

## 2018-09-22 DIAGNOSIS — R1011 Right upper quadrant pain: Secondary | ICD-10-CM | POA: Diagnosis present

## 2018-09-22 LAB — CBC WITH DIFFERENTIAL/PLATELET
Basophils Absolute: 0 10*3/uL (ref 0.0–0.1)
Basophils Relative: 0 %
Eosinophils Absolute: 0.2 10*3/uL (ref 0.0–0.7)
Eosinophils Relative: 4 %
HCT: 39 % (ref 36.0–46.0)
HEMOGLOBIN: 13.1 g/dL (ref 12.0–15.0)
LYMPHS ABS: 1.5 10*3/uL (ref 0.7–4.0)
LYMPHS PCT: 34 %
MCH: 29.8 pg (ref 26.0–34.0)
MCHC: 33.6 g/dL (ref 30.0–36.0)
MCV: 88.8 fL (ref 78.0–100.0)
Monocytes Absolute: 0.4 10*3/uL (ref 0.1–1.0)
Monocytes Relative: 10 %
NEUTROS PCT: 52 %
Neutro Abs: 2.3 10*3/uL (ref 1.7–7.7)
Platelets: 296 10*3/uL (ref 150–400)
RBC: 4.39 MIL/uL (ref 3.87–5.11)
RDW: 12.1 % (ref 11.5–15.5)
WBC: 4.5 10*3/uL (ref 4.0–10.5)

## 2018-09-22 LAB — COMPREHENSIVE METABOLIC PANEL
ALT: 12 U/L (ref 0–44)
ANION GAP: 8 (ref 5–15)
AST: 25 U/L (ref 15–41)
Albumin: 3.6 g/dL (ref 3.5–5.0)
Alkaline Phosphatase: 80 U/L (ref 38–126)
BUN: 7 mg/dL (ref 6–20)
CHLORIDE: 106 mmol/L (ref 98–111)
CO2: 27 mmol/L (ref 22–32)
Calcium: 9.6 mg/dL (ref 8.9–10.3)
Creatinine, Ser: 0.58 mg/dL (ref 0.44–1.00)
GFR calc non Af Amer: >60 mL/min (ref >60)
Glucose, Bld: 79 mg/dL (ref 70–99)
POTASSIUM: 3.5 mmol/L (ref 3.5–5.1)
SODIUM: 141 mmol/L (ref 135–145)
Total Bilirubin: 0.5 mg/dL (ref 0.3–1.2)
Total Protein: 8.1 g/dL (ref 6.5–8.1)

## 2018-09-22 LAB — URINALYSIS, ROUTINE W REFLEX MICROSCOPIC
Bilirubin Urine: NEGATIVE
GLUCOSE, UA: NEGATIVE mg/dL
HGB URINE DIPSTICK: NEGATIVE
Ketones, ur: NEGATIVE mg/dL
Nitrite: NEGATIVE
Protein, ur: NEGATIVE mg/dL
Specific Gravity, Urine: 1.02 (ref 1.005–1.030)
pH: 6 (ref 5.0–8.0)

## 2018-09-22 LAB — URINALYSIS, MICROSCOPIC (REFLEX)

## 2018-09-22 LAB — PREGNANCY, URINE: PREG TEST UR: NEGATIVE

## 2018-09-22 LAB — LIPASE, BLOOD: Lipase: 27 U/L (ref 11–51)

## 2018-09-22 MED ORDER — IOPAMIDOL (ISOVUE-300) INJECTION 61%
100.0000 mL | Freq: Once | INTRAVENOUS | Status: AC | PRN
  Administered 2018-09-22: 100 mL via INTRAVENOUS

## 2018-09-22 MED ORDER — MORPHINE SULFATE (PF) 4 MG/ML IV SOLN
4.0000 mg | Freq: Once | INTRAVENOUS | Status: AC
  Administered 2018-09-22: 4 mg via INTRAVENOUS
  Filled 2018-09-22: qty 1

## 2018-09-22 MED ORDER — SODIUM CHLORIDE 0.9 % IV BOLUS
1000.0000 mL | Freq: Once | INTRAVENOUS | Status: AC
  Administered 2018-09-22: 1000 mL via INTRAVENOUS

## 2018-09-22 NOTE — ED Notes (Signed)
ED Provider at bedside. 

## 2018-09-22 NOTE — Discharge Instructions (Addendum)
Your CT scan showed that you are constipated.  Please start taking miralax.  You may take one capful every day in 8 oz of fluid and this will produce a bowel movement in a few days.  You may take more than this, you may take up to 6 doses in 1 day, however the more you take the more likely you are to have painful abdominal cramping.  MiraLAX is safe to take while breast-feeding.  Please increase your fiber in your diet.  Today you received medications that may make you sleepy or impair your ability to make decisions.  For the next 24 hours please do not drive, operate heavy machinery, care for a small child with out another adult present, or perform any activities that may cause harm to you or someone else if you were to fall asleep or be impaired.

## 2018-09-22 NOTE — ED Notes (Signed)
Po fluid given

## 2018-09-22 NOTE — ED Notes (Signed)
Patient requesting pain medication upon discharge.  Explained to patient that pain medication would make her symptoms worse and further cause more constipation.  Patient voiced understanding.

## 2018-09-22 NOTE — ED Notes (Signed)
Patient transported to CT 
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
## 2018-09-22 NOTE — ED Notes (Signed)
Tolerated po fluid  Well. Denies nausea

## 2018-09-24 LAB — URINE CULTURE

## 2018-09-25 ENCOUNTER — Ambulatory Visit: Payer: BLUE CROSS/BLUE SHIELD | Admitting: Family Medicine

## 2018-10-05 ENCOUNTER — Encounter: Payer: Self-pay | Admitting: Obstetrics & Gynecology

## 2018-10-05 ENCOUNTER — Ambulatory Visit: Payer: BLUE CROSS/BLUE SHIELD | Admitting: Obstetrics & Gynecology

## 2018-10-05 ENCOUNTER — Ambulatory Visit (INDEPENDENT_AMBULATORY_CARE_PROVIDER_SITE_OTHER): Payer: BLUE CROSS/BLUE SHIELD | Admitting: Obstetrics & Gynecology

## 2018-10-05 DIAGNOSIS — Z1389 Encounter for screening for other disorder: Secondary | ICD-10-CM | POA: Diagnosis not present

## 2018-10-05 DIAGNOSIS — R109 Unspecified abdominal pain: Secondary | ICD-10-CM

## 2018-10-05 MED ORDER — IBUPROFEN 600 MG PO TABS: 600.0000 mg | ORAL_TABLET | Freq: Four times a day (QID) | ORAL | 0 refills | Status: AC | PRN

## 2018-10-05 NOTE — Patient Instructions (Signed)

## 2018-10-05 NOTE — Progress Notes (Signed)
Post Partum Exam  Alexandria Barber is a 22 y.o. G3P1021 female who presents for a postpartum visit. She is 8 weeks postpartum following a spontaneous vaginal delivery. I have fully reviewed the prenatal and intrapartum course. The delivery was at 40 gestational weeks.  Anesthesia: epidural. Postpartum course has been unremarkable. Baby's course has been unremarkable. Baby is feeding by breast. Bleeding no bleeding. Bowel function is normal. Bladder function is normal. Patient is sexually active. Contraception method is none. Postpartum depression screening:neg  The following portions of the patient's history were reviewed and updated as appropriate: allergies, current medications, past family history, past medical history, past social history, past surgical history and problem list.  Review of Systems Pertinent items are noted in HPI.    Objective:    BP 116/78 mmHg  Pulse 78  Resp 16  Ht 5\' 5"  (1.651 m)  Wt 211 lb (95.709 kg)  BMI 35.11 kg/m2  Breastfeeding? Yes 09/22/2018   CONSTITUTIONAL: Well-developed, well-nourished female in no acute distress.  HENT:  Normocephalic, atraumatic EYES: Conjunctivae and EOM are normal. No scleral icterus.GMarland [phone](AnnamariMarylande [id number].l ranNeMarland [id number] RMurAnnamariMaryla<MEA eterogeneous increased density and enlargement of the breast compatible with postpartum state.  Normal heart size. No pericardial pleural effusion. Minor basilar atelectasis. No acute lower chest finding.  Hepatobiliary: No focal liver abnormality is seen. No gallstones, gallbladder wall thickening, or biliary dilatation.  Pancreas: Unremarkable. No pancreatic ductal dilatation or surrounding inflammatory changes.  Spleen: Normal in size without focal abnormality.  Adrenals/Urinary Tract: Adrenal glands are unremarkable. Kidneys are normal, without renal calculi, focal lesion, or hydronephrosis. Bladder is unremarkable.  Stomach/Bowel: Negative for bowel obstruction, significant dilatation, ileus, or free air. Moderate colonic stool burden noted. Appendix not visualized. No fluid collection, hematoma, or abscess.  Vascular/Lymphatic: No significant vascular findings are present. No enlarged abdominal or pelvic lymph nodes.  Reproductive: Heterogeneous enlargement and enhancement of the uterus compatible with postpartum state. Small amount of pelvic free fluid, nonspecific. Left ovary contains a dominant follicle measuring 16 mm, image 65 series 2.  Other: Intact abdominal wall.  No hernia.  Musculoskeletal: No acute osseous finding.  IMPRESSION: Heterogeneous enhancement and residual enlargement of the uterus with trace pelvic free fluid, compatible with postpartum state.  Nonvisualization of the appendix  No definite acute intra-abdominopelvic finding by CT.  Enlargement and increased density of the breast parenchyma compatible with postpartum breast-feeding.  Assessment:    8 weeks postpartum exam. Pap smear not done at today's visit.   Plan:   1. Contraception: pt declines 2. May resume sexual activity in 2 weeks  3. Follow up in: 1 year or as needed.  4. Motrin 600mg   po q 6 hours prn pain   Yony Roulston L. Harraway-Smith, M.D., FACOG

## 2018-10-05 NOTE — Progress Notes (Signed)
Pt c/o "pain by her ribs"

## 2020-05-29 IMAGING — CT CT ABD-PELV W/ CM
2 of 4 series · 15 of 46 positions shown, 17 images · IV contrast (iopamidol)
Comparison: None.

CLINICAL DATA: Seven weeks postpartum from vaginal delivery, right
upper quadrant pain radiating to the back.

EXAM:
CT ABDOMEN AND PELVIS WITH CONTRAST
TECHNIQUE: Multidetector CT imaging of the abdomen and pelvis was performed
using the standard protocol following bolus administration of
intravenous contrast.
CONTRAST:  100mL WPJ2IA-LPP IOPAMIDOL (WPJ2IA-LPP) INJECTION 61%

[Series 2: axial st · axial · 0.93mm/px · z∈[-326,+74]mm · 12 of 96 slices shown, 14 images]
[im 8/96  soft-tissue]
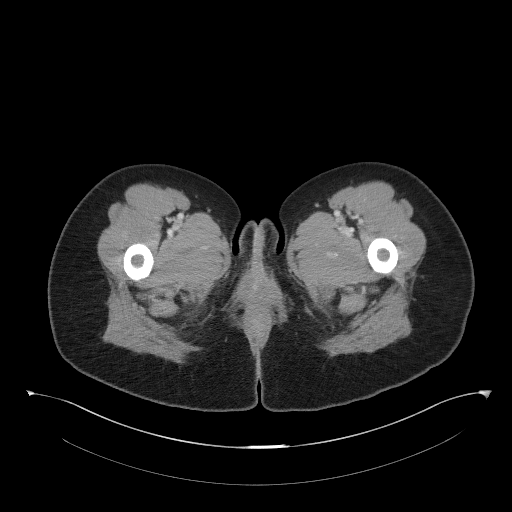
[im 8/96  bone]
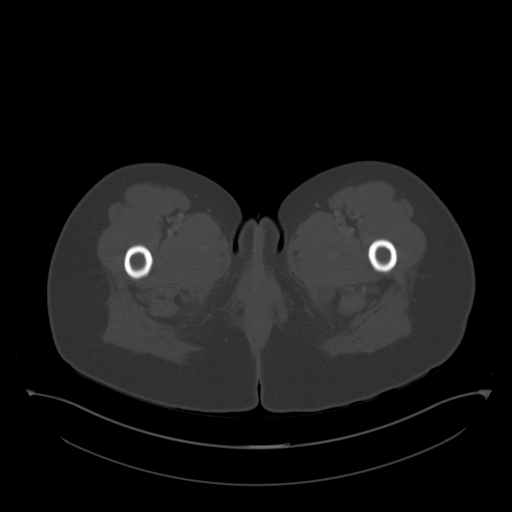
[im 15/96  soft-tissue]
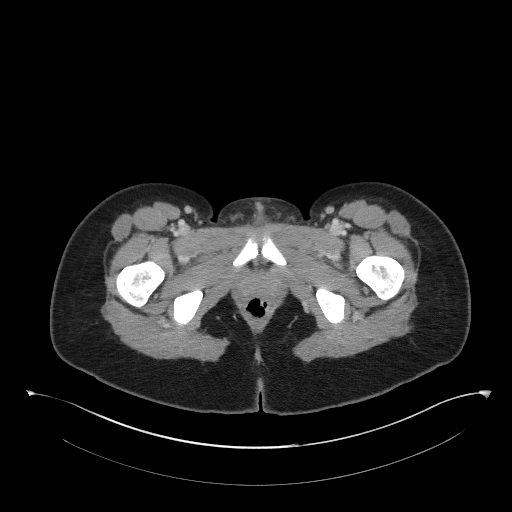
[im 22/96  soft-tissue]
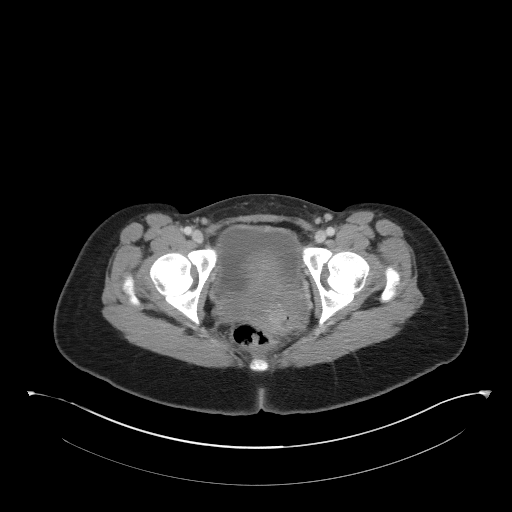
[im 30/96  soft-tissue]
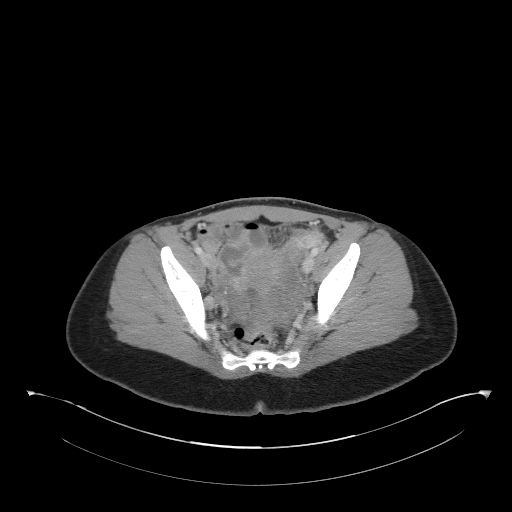
[im 37/96  soft-tissue]
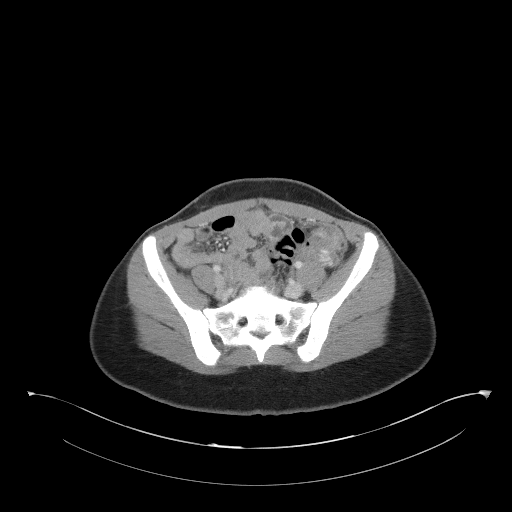
[im 44/96  soft-tissue]
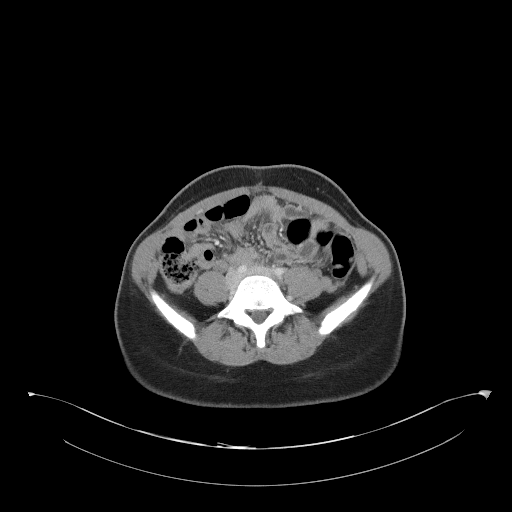
[im 52/96  soft-tissue]
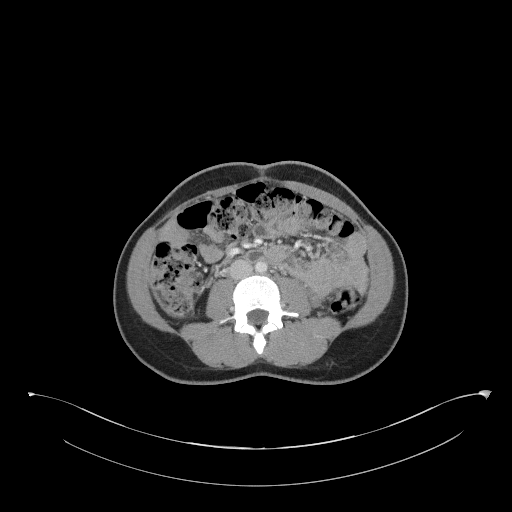
[im 59/96  soft-tissue]
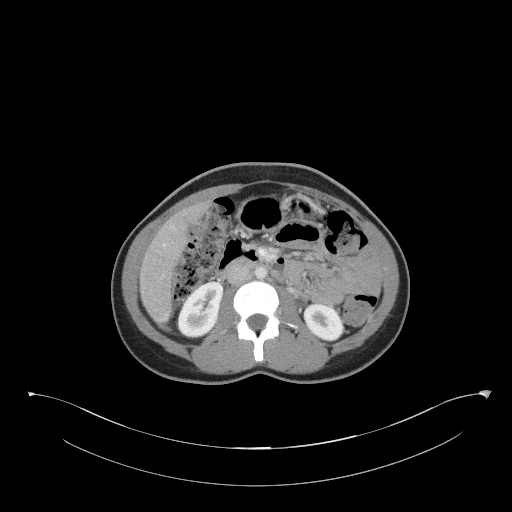
[im 66/96  soft-tissue]
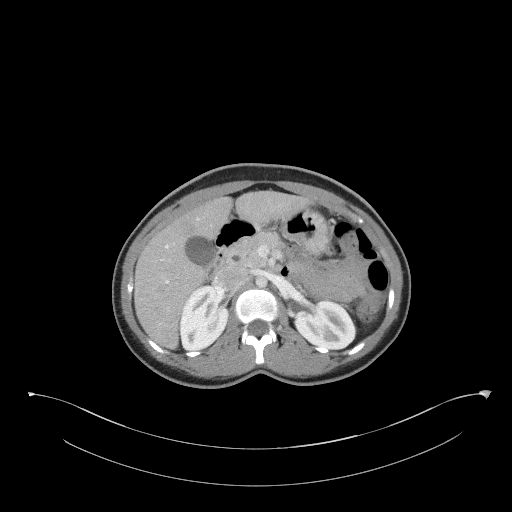
[im 66/96  bone]
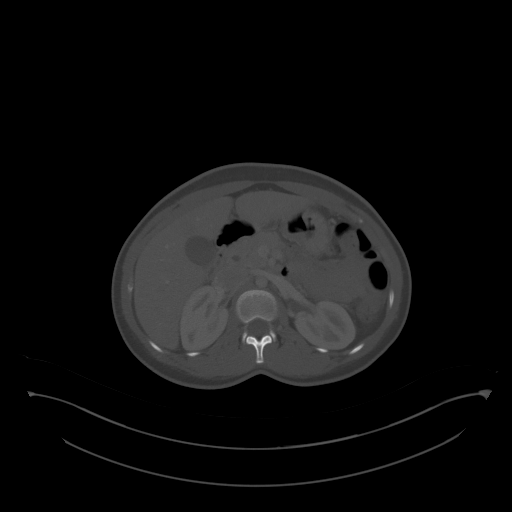
[im 74/96  soft-tissue]
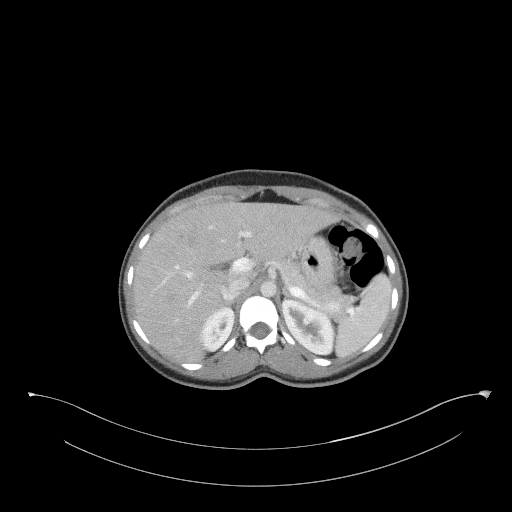
[im 81/96  soft-tissue]
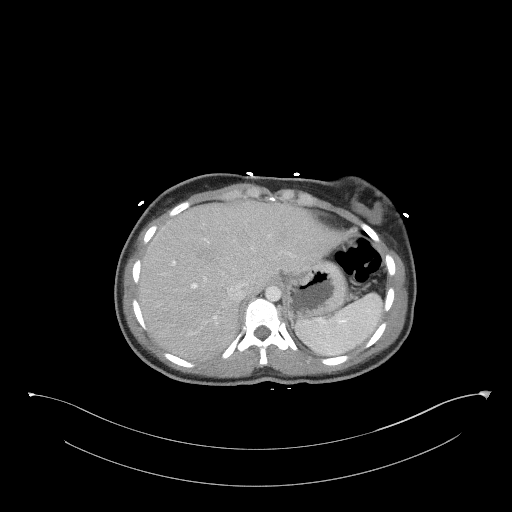
[im 88/96  soft-tissue]
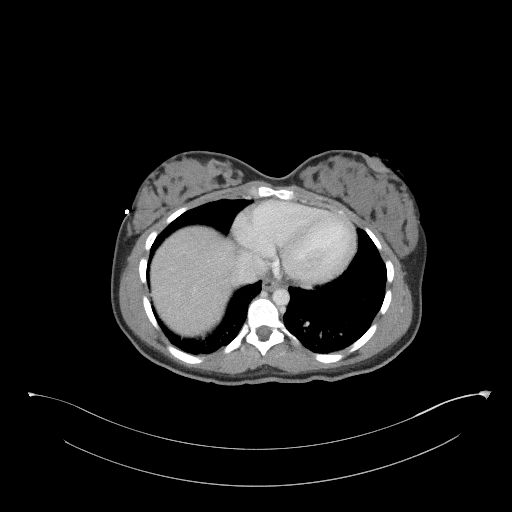

[Series 5: coronal st · coronal · 0.72mm/px · 3 of 85 slices shown]
[im 29/85  soft-tissue]
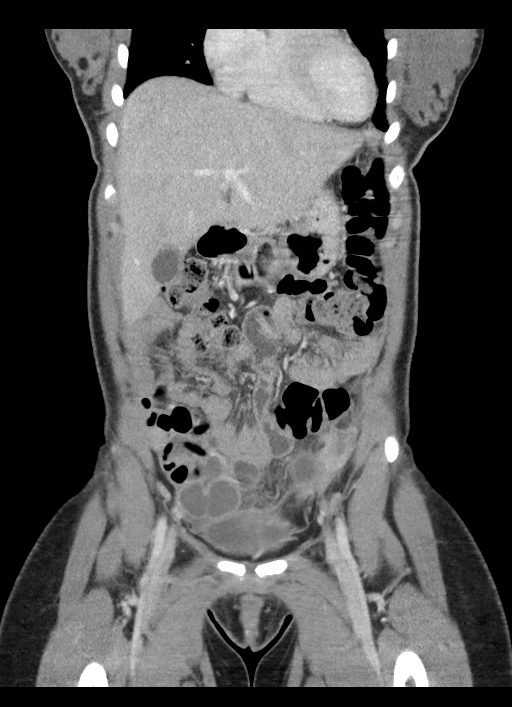
[im 38/85  soft-tissue]
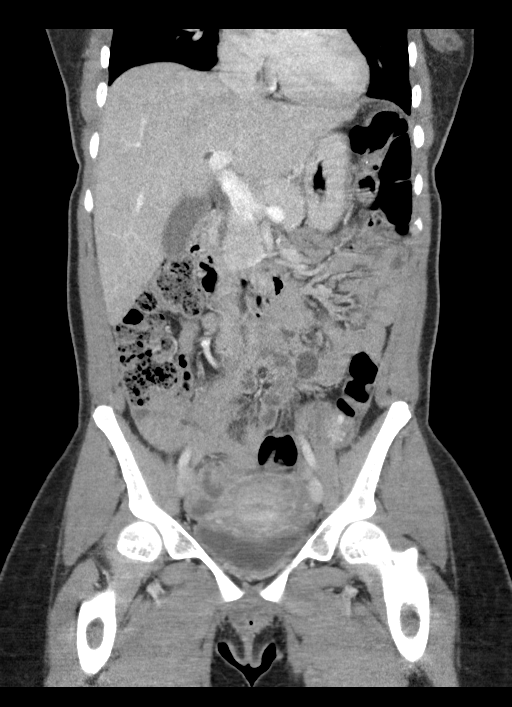
[im 47/85  soft-tissue]
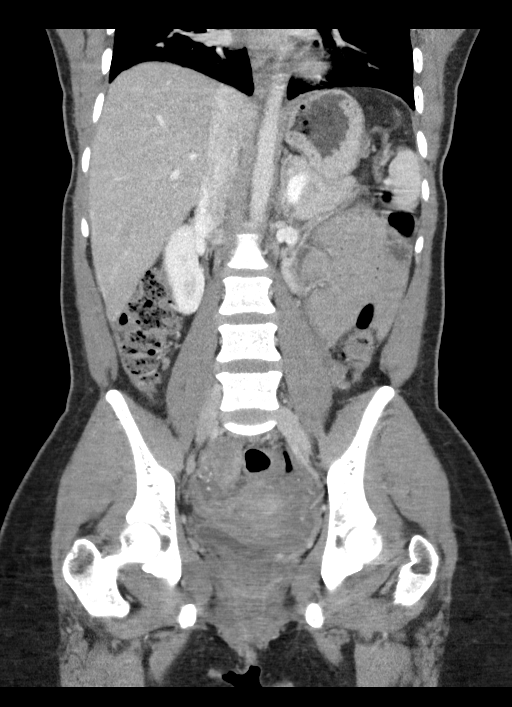

[15 of 46 positions shown; findings below may reference images not displayed]

FINDINGS: Lower chest: Heterogeneous increased density and enlargement of the
breast compatible with postpartum state.

Normal heart size. No pericardial pleural effusion. Minor basilar
atelectasis. No acute lower chest finding.

Hepatobiliary: No focal liver abnormality is seen. No gallstones,
gallbladder wall thickening, or biliary dilatation.

Pancreas: Unremarkable. No pancreatic ductal dilatation or
surrounding inflammatory changes.

Spleen: Normal in size without focal abnormality.

Adrenals/Urinary Tract: Adrenal glands are unremarkable. Kidneys are
normal, without renal calculi, focal lesion, or hydronephrosis.
Bladder is unremarkable.

Stomach/Bowel: Negative for bowel obstruction, significant
dilatation, ileus, or free air. Moderate colonic stool burden noted.
Appendix not visualized. No fluid collection, hematoma, or abscess.

Vascular/Lymphatic: No significant vascular findings are present. No
enlarged abdominal or pelvic lymph nodes.

Reproductive: Heterogeneous enlargement and enhancement of the
uterus compatible with postpartum state. Small amount of pelvic free
fluid, nonspecific. Left ovary contains a dominant follicle
measuring 16 mm, image 65 series 2.

Other: Intact abdominal wall.  No hernia.

Musculoskeletal: No acute osseous finding.
IMPRESSION: Heterogeneous enhancement and residual enlargement of the uterus
with trace pelvic free fluid, compatible with postpartum state.

Nonvisualization of the appendix

No definite acute intra-abdominopelvic finding by CT.

Enlargement and increased density of the breast parenchyma
compatible with postpartum breast-feeding.
